# Patient Record
Sex: Male | Born: 1995 | Race: White | Hispanic: No | Marital: Single | Smoking: Never smoker
Health system: Southern US, Community
[De-identification: ages and names within clinical notes are randomized; demographics above are authoritative.]

## PROBLEM LIST (undated history)

## (undated) DIAGNOSIS — I514 Myocarditis, unspecified: Secondary | ICD-10-CM

## (undated) DIAGNOSIS — J302 Other seasonal allergic rhinitis: Secondary | ICD-10-CM

---

## 2016-01-27 ENCOUNTER — Emergency Department
Admission: EM | Admit: 2016-01-27 | Discharge: 2016-01-27 | Disposition: A | Discharge status: Home / Self Care | Attending: Student | Admitting: Student

## 2016-01-27 ENCOUNTER — Emergency Department

## 2016-01-27 ENCOUNTER — Encounter: Payer: Self-pay | Admitting: Emergency Medicine

## 2016-01-27 DIAGNOSIS — Y9389 Activity, other specified: Secondary | ICD-10-CM | POA: Insufficient documentation

## 2016-01-27 DIAGNOSIS — M79671 Pain in right foot: Secondary | ICD-10-CM

## 2016-01-27 DIAGNOSIS — Y999 Unspecified external cause status: Secondary | ICD-10-CM | POA: Insufficient documentation

## 2016-01-27 DIAGNOSIS — X501XXA Overexertion from prolonged static or awkward postures, initial encounter: Secondary | ICD-10-CM | POA: Insufficient documentation

## 2016-01-27 DIAGNOSIS — Y929 Unspecified place or not applicable: Secondary | ICD-10-CM | POA: Insufficient documentation

## 2016-01-27 DIAGNOSIS — S93601A Unspecified sprain of right foot, initial encounter: Secondary | ICD-10-CM | POA: Insufficient documentation

## 2016-01-27 MED ORDER — NAPROXEN 500 MG PO TBEC: 500.0000 mg | DELAYED_RELEASE_TABLET | Freq: Two times a day (BID) | ORAL | 0 refills | Status: DC

## 2016-01-27 NOTE — ED Provider Notes (Signed)
[facility] Emergency Department Provider Note ____________________________________________  Time seen: 29  I have reviewed the triage vital signs and the nursing notes.  HISTORY  Chief Complaint  Foot Pain  HPI Pedro Pugh is a 20 y.o. male presents to the ED for evaluation of increasing pain to the right foot. Patient describes pain to the medial right foot that began about a week prior. He describes significant increase in the pain today as well as pain with weightbearing. He denies any recent injury, accident, or trauma. The patient reports a recent history of a minor sprain to the right ankle about a month and a half earlier.  History reviewed. No pertinent past medical history.  There are no active problems to display for this patient.  No past surgical history on file.  Prior to Admission medications   Medication Sig Start Date End Date Taking? Authorizing Provider  naproxen (EC NAPROSYN) 500 MG EC tablet Take 1 tablet (500 mg total) by mouth 2 (two) times daily with a meal. 01/27/16   Shiah Berhow V Bacon Yoland Scherr, PA-C   Allergies Review of patient's allergies indicates no known allergies.  No family history on file.  Social History Social History  Substance Use Topics  . Smoking status: Not on file  . Smokeless tobacco: Not on file  . Alcohol use Not on file    Review of Systems  Constitutional: Negative for fever. Musculoskeletal: Negative for back pain. Right foot pain as above. Skin: Negative for rash. Neurological: Negative for headaches, focal weakness or numbness. ____________________________________________  PHYSICAL EXAM:  VITAL SIGNS: ED Triage Vitals [01/27/16 1800]  Enc Vitals Group     BP 136/88     Pulse Rate 87     Resp 16     Temp 98 F (36.7 C)     Temp Source Oral     SpO2 96 %     Weight 180 lb (81.6 kg)     Height 5\' 11"  (1.803 m)     Head Circumference      Peak Flow      Pain Score 7     Pain Loc    Pain Edu?      Excl. in GC?    Constitutional: Alert and oriented. Well appearing and in no distress. Head: Normocephalic and atraumatic. Cardiovascular: Normal distal pulses and capillary refill  Respiratory: Normal respiratory effort. Musculoskeletal: Right foot without obvious deformity, effusion, or dislocation. Patient with warm dry skin without ecchymosis, bruise, or abrasion. He localizes tenderness to the medial aspect of the right foot at the cuneiform bone. No calf or Achilles tenderness is appreciated. Full active range of motion of the ankle is noted negative drawer sign. Nontender with normal range of motion in all other extremities.  Neurologic:  Antalgic gait without ataxia. Normal speech and language. No gross focal neurologic deficits are appreciated. Skin:  Skin is warm, dry and intact. No rash noted. ____________________________________________   RADIOLOGY  Right Foot  IMPRESSION: Negative.  I, Carlyn Mullenbach, Charlesetta Ivory, personally viewed and evaluated these images (plain radiographs) as part of my medical decision making, as well as reviewing the written report by the radiologist. ____________________________________________  PROCEDURES  Ace bandage  Post-op shoe   ____________________________________________  INITIAL IMPRESSION / ASSESSMENT AND PLAN / ED COURSE  Patient with an acute foot pain without indication of occult fracture this time. He said with an Ace bandage and a postop shoe for comfort. He will follow with podiatry for ongoing  symptom management. He is discharged with a prescription for Naprosyn dose as directed. Return precautions are reviewed.  Clinical Course   ____________________________________________  FINAL CLINICAL IMPRESSION(S) / ED DIAGNOSES  Final diagnoses:  Foot pain, right  Sprain of right foot, initial encounter     Lissa HoardJenise V Bacon Burma Ketcher, PA-C 01/27/16 1911    Gayla DossEryka A Gayle, MD 01/27/16 1958

## 2016-01-27 NOTE — ED Triage Notes (Signed)
Pt reports right foot pain x1 week; reports hx of ankle fx 1 month ago. [facility] and some concern for stress fx. Pt denies any new injury.

## 2016-01-27 NOTE — ED Notes (Addendum)
Pt reports right foot hurts - [facility] and they told him he needed an xray - he rolled his foot the first week of Sept. And the pain got better but last week the pain returned and the right foot began to swell - pt has difficulty bearing weight on the foot but gait was steady walking to room

## 2016-01-27 NOTE — Discharge Instructions (Signed)
Wear the ace bandage and the post-op shoe as needed for support and comfort. Follow-up with podiatry for ongoing symptoms. Take the anti-inflammatory as needed for pain and inflammation.

## 2016-03-19 ENCOUNTER — Observation Stay
Admission: EM | Admit: 2016-03-19 | Discharge: 2016-03-20 | Disposition: A | Discharge status: Home / Self Care | Attending: Internal Medicine | Admitting: Internal Medicine

## 2016-03-19 ENCOUNTER — Emergency Department

## 2016-03-19 DIAGNOSIS — R072 Precordial pain: Secondary | ICD-10-CM | POA: Diagnosis not present

## 2016-03-19 DIAGNOSIS — R519 Headache, unspecified: Secondary | ICD-10-CM

## 2016-03-19 DIAGNOSIS — B349 Viral infection, unspecified: Secondary | ICD-10-CM | POA: Insufficient documentation

## 2016-03-19 DIAGNOSIS — R112 Nausea with vomiting, unspecified: Secondary | ICD-10-CM | POA: Diagnosis not present

## 2016-03-19 DIAGNOSIS — I514 Myocarditis, unspecified: Secondary | ICD-10-CM

## 2016-03-19 DIAGNOSIS — Z7982 Long term (current) use of aspirin: Secondary | ICD-10-CM | POA: Insufficient documentation

## 2016-03-19 DIAGNOSIS — R079 Chest pain, unspecified: Secondary | ICD-10-CM

## 2016-03-19 DIAGNOSIS — R739 Hyperglycemia, unspecified: Secondary | ICD-10-CM | POA: Diagnosis not present

## 2016-03-19 DIAGNOSIS — I10 Essential (primary) hypertension: Secondary | ICD-10-CM | POA: Diagnosis not present

## 2016-03-19 DIAGNOSIS — R7989 Other specified abnormal findings of blood chemistry: Secondary | ICD-10-CM

## 2016-03-19 DIAGNOSIS — I071 Rheumatic tricuspid insufficiency: Secondary | ICD-10-CM | POA: Diagnosis not present

## 2016-03-19 DIAGNOSIS — R0602 Shortness of breath: Secondary | ICD-10-CM | POA: Insufficient documentation

## 2016-03-19 DIAGNOSIS — R51 Headache: Secondary | ICD-10-CM | POA: Diagnosis present

## 2016-03-19 DIAGNOSIS — R778 Other specified abnormalities of plasma proteins: Secondary | ICD-10-CM

## 2016-03-19 HISTORY — DX: Other seasonal allergic rhinitis: J30.2

## 2016-03-19 LAB — URINE DRUG SCREEN, QUALITATIVE (ARMC ONLY)
Amphetamines, Ur Screen: NOT DETECTED
BARBITURATES, UR SCREEN: NOT DETECTED
Benzodiazepine, Ur Scrn: NOT DETECTED
CANNABINOID 50 NG, UR ~~LOC~~: NOT DETECTED
COCAINE METABOLITE, UR ~~LOC~~: NOT DETECTED
MDMA (Ecstasy)Ur Screen: NOT DETECTED
Methadone Scn, Ur: NOT DETECTED
OPIATE, UR SCREEN: NOT DETECTED
Phencyclidine (PCP) Ur S: NOT DETECTED
Tricyclic, Ur Screen: NOT DETECTED

## 2016-03-19 LAB — TROPONIN I
Troponin I: 0.15 ng/mL (ref <0.03)
Troponin I: 0.35 ng/mL (ref <0.03)
Troponin I: 0.32 ng/mL

## 2016-03-19 LAB — CBC
HEMATOCRIT: 45.5 % (ref 40.0–52.0)
Hemoglobin: 15.9 g/dL (ref 13.0–18.0)
MCH: 30.7 pg (ref 26.0–34.0)
MCHC: 35 g/dL (ref 32.0–36.0)
MCV: 87.8 fL (ref 80.0–100.0)
Platelets: 207 10*3/uL (ref 150–440)
RBC: 5.18 MIL/uL (ref 4.40–5.90)
RDW: 12.7 % (ref 11.5–14.5)
WBC: 5.8 10*3/uL (ref 3.8–10.6)

## 2016-03-19 LAB — BASIC METABOLIC PANEL
Anion gap: 7 (ref 5–15)
BUN: 12 mg/dL (ref 6–20)
CHLORIDE: 103 mmol/L (ref 101–111)
CO2: 27 mmol/L (ref 22–32)
Calcium: 9.4 mg/dL (ref 8.9–10.3)
Creatinine, Ser: 0.85 mg/dL (ref 0.61–1.24)
GFR calc Af Amer: >60 mL/min (ref >60)
GLUCOSE: 104 mg/dL — AB (ref 65–99)
POTASSIUM: 3.8 mmol/L (ref 3.5–5.1)
Sodium: 137 mmol/L (ref 135–145)

## 2016-03-19 LAB — TSH: TSH: 2.838 u[IU]/mL (ref 0.350–4.500)

## 2016-03-19 MED ORDER — SODIUM CHLORIDE 0.9% FLUSH
3.0000 mL | Freq: Two times a day (BID) | INTRAVENOUS | Status: DC
  Administered 2016-03-19 – 2016-03-20 (×2): 3 mL via INTRAVENOUS

## 2016-03-19 MED ORDER — ASPIRIN EC 325 MG PO TBEC
325.0000 mg | DELAYED_RELEASE_TABLET | Freq: Four times a day (QID) | ORAL | Status: DC
Start: 2016-03-19 — End: 2016-03-20
  Administered 2016-03-19 – 2016-03-20 (×5): 325 mg via ORAL
  Filled 2016-03-19 (×3): qty 1

## 2016-03-19 MED ORDER — INFLUENZA VAC SPLIT QUAD 0.5 ML IM SUSY
0.5000 mL | PREFILLED_SYRINGE | INTRAMUSCULAR | Status: AC
  Administered 2016-03-20: 0.5 mL via INTRAMUSCULAR
  Filled 2016-03-19: qty 0.5

## 2016-03-19 MED ORDER — CARVEDILOL 6.25 MG PO TABS
6.2500 mg | ORAL_TABLET | Freq: Two times a day (BID) | ORAL | Status: DC
  Administered 2016-03-20: 6.25 mg via ORAL
  Filled 2016-03-19: qty 1

## 2016-03-19 MED ORDER — PANTOPRAZOLE SODIUM 40 MG PO TBEC
40.0000 mg | DELAYED_RELEASE_TABLET | Freq: Every day | ORAL | Status: DC
  Administered 2016-03-19 – 2016-03-20 (×2): 40 mg via ORAL
  Filled 2016-03-19 (×2): qty 1

## 2016-03-19 MED ORDER — SODIUM CHLORIDE 0.9 % IV SOLN: 250.0000 mL | INTRAVENOUS | Status: DC | PRN

## 2016-03-19 MED ORDER — INDOMETHACIN 50 MG PO CAPS
50.0000 mg | ORAL_CAPSULE | Freq: Once | ORAL | Status: AC
  Administered 2016-03-19: 50 mg via ORAL
  Filled 2016-03-19 (×2): qty 1

## 2016-03-19 MED ORDER — ACETAMINOPHEN 650 MG RE SUPP: 650.0000 mg | Freq: Four times a day (QID) | RECTAL | Status: DC | PRN

## 2016-03-19 MED ORDER — ENOXAPARIN SODIUM 40 MG/0.4ML ~~LOC~~ SOLN
40.0000 mg | SUBCUTANEOUS | Status: DC
  Administered 2016-03-19: 40 mg via SUBCUTANEOUS
  Filled 2016-03-19: qty 0.4

## 2016-03-19 MED ORDER — SODIUM CHLORIDE 0.9 % IV BOLUS (SEPSIS)
1000.0000 mL | Freq: Once | INTRAVENOUS | Status: AC
  Administered 2016-03-19: 1000 mL via INTRAVENOUS

## 2016-03-19 MED ORDER — PROCHLORPERAZINE EDISYLATE 5 MG/ML IJ SOLN
10.0000 mg | Freq: Once | INTRAMUSCULAR | Status: AC
  Administered 2016-03-19: 10 mg via INTRAVENOUS
  Filled 2016-03-19: qty 2

## 2016-03-19 MED ORDER — IOPAMIDOL (ISOVUE-370) INJECTION 76%
75.0000 mL | Freq: Once | INTRAVENOUS | Status: AC | PRN
  Administered 2016-03-19: 75 mL via INTRAVENOUS

## 2016-03-19 MED ORDER — ACETAMINOPHEN 325 MG PO TABS
650.0000 mg | ORAL_TABLET | Freq: Four times a day (QID) | ORAL | Status: DC | PRN
  Administered 2016-03-20: 650 mg via ORAL
  Filled 2016-03-19: qty 2

## 2016-03-19 MED ORDER — ASPIRIN EC 81 MG PO TBEC
81.0000 mg | DELAYED_RELEASE_TABLET | Freq: Every day | ORAL | Status: DC
  Administered 2016-03-20: 81 mg via ORAL
  Filled 2016-03-19: qty 1

## 2016-03-19 MED ORDER — ONDANSETRON HCL 4 MG PO TABS: 4.0000 mg | ORAL_TABLET | Freq: Four times a day (QID) | ORAL | Status: DC | PRN

## 2016-03-19 MED ORDER — SODIUM CHLORIDE 0.9% FLUSH
3.0000 mL | Freq: Two times a day (BID) | INTRAVENOUS | Status: DC
  Administered 2016-03-20: 3 mL via INTRAVENOUS

## 2016-03-19 MED ORDER — DIPHENHYDRAMINE HCL 50 MG/ML IJ SOLN
25.0000 mg | Freq: Once | INTRAMUSCULAR | Status: AC
Start: 2016-03-19 — End: 2016-03-19
  Administered 2016-03-19: 25 mg via INTRAVENOUS
  Filled 2016-03-19: qty 1

## 2016-03-19 MED ORDER — ONDANSETRON HCL 4 MG/2ML IJ SOLN: 4.0000 mg | Freq: Four times a day (QID) | INTRAMUSCULAR | Status: DC | PRN

## 2016-03-19 MED ORDER — SODIUM CHLORIDE 0.9% FLUSH: 3.0000 mL | INTRAVENOUS | Status: DC | PRN

## 2016-03-19 MED ORDER — CARVEDILOL 3.125 MG PO TABS
3.1250 mg | ORAL_TABLET | Freq: Once | ORAL | Status: AC
  Administered 2016-03-19: 3.125 mg via ORAL
  Filled 2016-03-19: qty 1

## 2016-03-19 MED ORDER — CARVEDILOL 6.25 MG PO TABS
3.1250 mg | ORAL_TABLET | Freq: Two times a day (BID) | ORAL | Status: DC
  Administered 2016-03-19: 3.125 mg via ORAL
  Filled 2016-03-19: qty 1

## 2016-03-19 MED ORDER — ASPIRIN EC 325 MG PO TBEC
DELAYED_RELEASE_TABLET | ORAL | Status: AC
  Administered 2016-03-19: 325 mg via ORAL
  Filled 2016-03-19: qty 1

## 2016-03-19 NOTE — ED Provider Notes (Signed)
[facility] Emergency Department Provider Note  ____________________________________________   First MD Initiated Contact with Patient 03/19/16 [phone]     (approximate)  I have reviewed the triage vital signs and the nursing notes.   HISTORY  Chief Complaint Headache; Emesis; and Chest Pain   HPI Pedro Pugh is a 20 y.o. male with a history of chronic tension headaches was presented to the emergency department 2 days of headache. He says that he was awoken 2 nights ago in the middle the night with a 9 out of 10 sudden onset headache. He says that since then the headache had decreased but then this morning about 3 AM he had again sudden onset of his headache which she describes as a 9 out of 10. He says it is an 8 out of 10 at this time and it is associated with neck stiffness. He has vomited once and he says that he has photophobia. He says that he also feels dizzy especially when he moves his head from side to side or stands. Says that the headache also is worsening with movement and standing. Took a gram of Tylenol this morning but says that he vomited soon after.  Patient said that he also had an aching 4-5 out of 10 chest pain this morning that has now reduced down to a 2. He says that it was in the center of his chest that was nonradiating. Denies any shortness of breath.  History reviewed. No pertinent past medical history.  There are no active problems to display for this patient.   No past surgical history on file.  Prior to Admission medications   Medication Sig Start Date End Date Taking? Authorizing Provider  meloxicam (MOBIC) 15 MG tablet Take 15 tablets by mouth daily. 03/03/16 04/02/16 Yes Historical Provider, MD  naproxen (EC NAPROSYN) 500 MG EC tablet Take 1 tablet (500 mg total) by mouth 2 (two) times daily with a meal. Patient not taking: Reported on 03/19/2016 01/27/16   Charlesetta Ivory Menshew, PA-C    Allergies Patient has no known  allergies.  No family history on file.  Social History Social History  Substance Use Topics  . Smoking status: Not on file  . Smokeless tobacco: Not on file  . Alcohol use Not on file    Review of Systems Constitutional: No fever/chills Eyes: No visual changes. ENT: No sore throat. Cardiovascular: As above Respiratory: Denies shortness of breath. Gastrointestinal: No abdominal pain.  No nausea, no vomiting.  No diarrhea.  No constipation. Genitourinary: Negative for dysuria. Musculoskeletal: Negative for back pain. Skin: Negative for rash. Neurological: Negative for focal weakness or numbness.  10-point ROS otherwise negative.  ____________________________________________   PHYSICAL EXAM:  VITAL SIGNS: ED Triage Vitals [03/19/16 0823]  Enc Vitals Group     BP (!) 142/86     Pulse Rate 96     Resp 18     Temp 99.3 F (37.4 C)     Temp Source Oral     SpO2 96 %     Weight 175 lb (79.4 kg)     Height 5\' 11"  (1.803 m)     Head Circumference      Peak Flow      Pain Score 8     Pain Loc      Pain Edu?      Excl. in GC?     Constitutional: Alert and oriented. Well appearing and in no acute distress. Eyes: Conjunctivae are normal.  PERRL. EOMI. Head: Atraumatic. Nose: No congestion/rhinnorhea. Mouth/Throat: Mucous membranes are moist.   Neck: No stridor.  Able to range head and neck but says it feels stiff. Cardiovascular: Normal rate, regular rhythm. Grossly normal heart sounds.  Equal and bilateral radial as well as dorsalis pedis pulses. Respiratory: Normal respiratory effort.  No retractions. Lungs CTAB. Gastrointestinal: Soft and nontender. No distention. No abdominal bruits. No CVA tenderness. Musculoskeletal: No lower extremity tenderness nor edema.  No joint effusions. Neurologic:  Normal speech and language. No gross focal neurologic deficits are appreciated. No nystagmus. No ataxia on finger to nose or heel to shin testing. Skin:  Skin is warm, dry and  intact. No rash noted. Psychiatric: Mood and affect are normal. Speech and behavior are normal.  ____________________________________________   LABS (all labs ordered are listed, but only abnormal results are displayed)  Labs Reviewed  BASIC METABOLIC PANEL - Abnormal; Notable for the following:       Result Value   Glucose, Bld 104 (*)    All other components within normal limits  TROPONIN I - Abnormal; Notable for the following:    Troponin I 0.15 (*)    All other components within normal limits  TROPONIN I - Abnormal; Notable for the following:    Troponin I 0.35 (*)    All other components within normal limits  CBC  URINE DRUG SCREEN, QUALITATIVE ([facility] ONLY)   ____________________________________________  EKG  ED ECG REPORT I, Arelia LongestSchaevitz,  David M, the attending physician, personally viewed and interpreted this ECG.   Date: 03/19/2016  EKG Time: 0830  Rate: 95  Rhythm: normal sinus rhythm  Axis: Normal  Intervals:none  ST&T Change: No ST segment elevation or depression. No abnormal T-wave inversion.  ____________________________________________  RADIOLOGY    CT Head Wo Contrast (Final result)  Result time 03/19/16 10:30:48  Final result by Bretta BangWilliam Woodruff III, MD (03/19/16 10:30:48)           Narrative:   CLINICAL DATA: Severe headache for 1 day with nausea and vomiting  EXAM: CT HEAD WITHOUT CONTRAST  TECHNIQUE: Contiguous axial images were obtained from the base of the skull through the vertex without intravenous contrast.  COMPARISON: None.  FINDINGS: Brain: The ventricles are normal in size and configuration. There is no intracranial mass, hemorrhage, extra-axial fluid collection, or midline shift. Gray-white compartments appear normal. No acute infarct evident.  Vascular: There is no hyperdense vessel. There are no vascular calcifications.  Skull: The bony calvarium appears intact.  Sinuses/Orbits: There is mild mucosal thickening in  several ethmoid air cells bilaterally. Visualized paranasal sinuses elsewhere clear. Visualized orbits appear symmetric bilaterally.  Other: Visualized mastoid air cells are clear bilaterally.  IMPRESSION: Mild ethmoid air cell thickening at several sites. No intracranial mass hemorrhage, or extra-axial fluid collection. Gray-white compartments appear normal.   Electronically Signed By: Bretta BangWilliam Woodruff III M.D. On: 03/19/2016 10:30            DG Chest 2 View (Final result)  Result time 03/19/16 09:01:06  Final result by David A SwazilandJordan, MD (03/19/16 09:01:06)           Narrative:   CLINICAL DATA: Central chest burning sensation associated with dizziness, shortness of breath, and vomiting since this morning. Nonsmoker.  EXAM: CHEST 2 VIEW  COMPARISON: Chest x-ray of March 19, 2016  FINDINGS: The lungs are well-expanded and clear. The heart and pulmonary vascularity are normal. The mediastinum is normal in width. There is no pleural effusion, pneumothorax, or pneumomediastinum.  The observed bony thorax is unremarkable.  IMPRESSION: There is no active cardiopulmonary disease.   Electronically Signed By: David SwazilandJordan M.D. On: 03/19/2016 09:01            ____________________________________________   PROCEDURES  Procedure(s) performed:   Procedures  Critical Care performed:   ____________________________________________   INITIAL IMPRESSION / ASSESSMENT AND PLAN / ED COURSE  Pertinent labs & imaging results that were available during my care of the patient were reviewed by me and considered in my medical decision making (see chart for details).   Clinical Course    ----------------------------------------- 1:54 PM on 03/19/2016 -----------------------------------------  Patient with headache now at a 3 out of 10. Still says he has mild neck stiffness. However he is able to fully range his head and his neck. No documented  fever. His troponin continues to trend upward. I'm suspecting that this is consistent with myocarditis likely from a viral cause. Patient with a headache history with 2 severe headaches over the last 2 days. However, very unlikely to have concomitant subarachnoid hemorrhage with myocarditis. Reassuring CAT scans without evidence of acute pathology. We will forego lumbar puncture at this time because of more likely diagnosis. Discussed the case with Dr. Welton FlakesKhan of cardiology who recommends Indocin. I discussed the case with the patient as well as the patient's mother who is an emergency department nurse. They are understanding of the plan for admission and elected to comply. Signed out to Dr. Seth BakeV. Headaches likely related to viral syndrome.   ____________________________________________   FINAL CLINICAL IMPRESSION(S) / ED DIAGNOSES  Myocarditis. Headache.    NEW MEDICATIONS STARTED DURING THIS VISIT:  New Prescriptions   No medications on file     Note:  This document was prepared using Dragon voice recognition software and may include unintentional dictation errors.    Myrna Blazeravid Matthew Schaevitz, MD 03/19/16 1356

## 2016-03-19 NOTE — H&P (Signed)
[facility] [place] [facility]   PATIENT NAME: Pedro Pugh    MR#:  [id number]  DATE OF BIRTH:  05/11/1995  DATE OF ADMISSION:  03/19/2016  PRIMARY CARE PHYSICIAN: No PCP Per Patient   REQUESTING/REFERRING PHYSICIAN:   CHIEF COMPLAINT:   Chief Complaint  Patient presents with  . Headache  . Emesis  . Chest Pain    HISTORY OF PRESENT ILLNESS: Pedro Pugh  is a 20 y.o. male with a known history of Tension headaches, seasonal allergies,, who presents to the hospital with complaints of severe headache and chest pain. He did patient was doing well up until morning at around 3 AM when he was woken up by severe chest pain and headache. Pain was described as midsternal chest pain, throbbing, burning sensation with no alleviating or aggravating factors, with no radiation, accompanied by shortness of breath, dizziness, lightheadedness. He also had severe headache which was temporal and in the occipital area, neck , it  was 10/10 , and similar location to his tension headaches, but so much more intense. Because of this severe discomfort, he decided to come to emergency room for further evaluation and treatment. He also had epigastric abdominal discomfort that had nausea and vomiting episode earlier today. Hospitalist services were contacted for admission. Since patient's troponin was found to be elevated. The patient was given Compazine and Benadryl, his headache subsided to 2 out of 10 by intensity.  PAST MEDICAL HISTORY:  Seasonal allergies, tension headaches  PAST SURGICAL HISTORY: No past surgical history on file.  SOCIAL HISTORY:  Social History  Substance Use Topics  . Smoking status: Not on file  . Smokeless tobacco: Not on file  . Alcohol use Not on file    FAMILY HISTORY: No early coronary artery disease .  DRUG ALLERGIES: No Known Allergies  Review of Systems  Eyes: Positive for blurred vision.  Cardiovascular: Positive for chest pain.   Gastrointestinal: Positive for abdominal pain, nausea and vomiting.    MEDICATIONS AT HOME:  Prior to Admission medications   Medication Sig Start Date End Date Taking? Authorizing Provider  meloxicam (MOBIC) 15 MG tablet Take 15 tablets by mouth daily. 03/03/16 04/02/16 Yes Historical Provider, MD  naproxen (EC NAPROSYN) 500 MG EC tablet Take 1 tablet (500 mg total) by mouth 2 (two) times daily with a meal. Patient not taking: Reported on 03/19/2016 01/27/16   Jenise V Bacon Menshew, PA-C      PHYSICAL EXAMINATION:   VITAL SIGNS: Blood pressure 124/84, pulse 85, temperature 99.3 F (37.4 C), temperature source Oral, resp. rate 14, height 5\' 11"  (1.803 m), weight 79.4 kg (175 lb), SpO2 97 %.  GENERAL:  20 y.o.-year-old patient lying in the bed with no acute distress.  EYES: Pupils equal, round, reactive to light and accommodation. No scleral icterus. Extraocular muscles intact.  HEENT: Head atraumatic, normocephalic. Oropharynx and nasopharynx clear.  NECK:  Supple, no jugular venous distention. No thyroid enlargement, no tenderness.  LUNGS: Normal breath sounds bilaterally, no wheezing, rales,rhonchi or crepitation. No use of accessory muscles of respiration.  CARDIOVASCULAR: S1, S2 normal. No murmurs, rubs, or gallops.  ABDOMEN: Soft, nontender, nondistended. Bowel sounds present. No organomegaly or mass.  EXTREMITIES: No pedal edema, cyanosis, or clubbing.  NEUROLOGIC: Cranial nerves II through XII are intact. Muscle strength 5/5 in all extremities. Sensation intact. Gait not checked.  PSYCHIATRIC: The patient is alert and oriented x 3.  SKIN: No obvious rash, lesion, or ulcer.   LABORATORY PANEL:  CBC  Recent Labs Lab 03/19/16 0831  WBC 5.8  HGB 15.9  HCT 45.5  PLT 207  MCV 87.8  MCH 30.7  MCHC 35.0  RDW 12.7   ------------------------------------------------------------------------------------------------------------------  Chemistries   Recent Labs Lab  03/19/16 0831  NA 137  K 3.8  CL 103  CO2 27  GLUCOSE 104*  BUN 12  CREATININE 0.85  CALCIUM 9.4   ------------------------------------------------------------------------------------------------------------------  Cardiac Enzymes  Recent Labs Lab 03/19/16 0831 03/19/16 1225  TROPONINI 0.15* 0.35*   ------------------------------------------------------------------------------------------------------------------  RADIOLOGY: Dg Chest 2 View  Result Date: 03/19/2016 CLINICAL DATA:  Central chest burning sensation associated with dizziness, shortness of breath, and vomiting since this morning. Nonsmoker. EXAM: CHEST  2 VIEW COMPARISON:  Chest x-ray of March 19, 2016 FINDINGS: The lungs are well-expanded and clear. The heart and pulmonary vascularity are normal. The mediastinum is normal in width. There is no pleural effusion, pneumothorax, or pneumomediastinum. The observed bony thorax is unremarkable. IMPRESSION: There is no active cardiopulmonary disease. Electronically Signed   By: David  SwazilandJordan M.D.   On: 03/19/2016 09:01   Ct Head Wo Contrast  Result Date: 03/19/2016 CLINICAL DATA:  Severe headache for 1 day with nausea and vomiting EXAM: CT HEAD WITHOUT CONTRAST TECHNIQUE: Contiguous axial images were obtained from the base of the skull through the vertex without intravenous contrast. COMPARISON:  None. FINDINGS: Brain: The ventricles are normal in size and configuration. There is no intracranial mass, hemorrhage, extra-axial fluid collection, or midline shift. Gray-white compartments appear normal. No acute infarct evident. Vascular: There is no hyperdense vessel. There are no vascular calcifications. Skull: The bony calvarium appears intact. Sinuses/Orbits: There is mild mucosal thickening in several ethmoid air cells bilaterally. Visualized paranasal sinuses elsewhere clear. Visualized orbits appear symmetric bilaterally. Other: Visualized mastoid air cells are clear  bilaterally. IMPRESSION: Mild ethmoid air cell thickening at several sites. No intracranial mass hemorrhage, or extra-axial fluid collection. Gray-white compartments appear normal. Electronically Signed   By: Bretta BangWilliam  Woodruff III M.D.   On: 03/19/2016 10:30   Ct Angio Chest Aorta W And/or Wo Contrast  Result Date: 03/19/2016 CLINICAL DATA:  Chest pain and shortness of breath EXAM: CT ANGIOGRAPHY CHEST WITH CONTRAST TECHNIQUE: Initially, axial CT images were obtained through the chest without intravenous contrast material administration. Multidetector CT imaging of the chest was performed using the standard protocol during bolus administration of intravenous contrast. Multiplanar CT image reconstructions and MIPs were obtained to evaluate the vascular anatomy. CONTRAST:  75 mL Isovue 370 nonionic COMPARISON:  Chest radiograph March 19, 2016 FINDINGS: Cardiovascular: No intramural hematoma evident on noncontrast enhanced study. There is no thoracic aortic aneurysm or dissection. No ulceration or mucosal lesion is evident by CT in the thoracic aorta. The visualized great vessels appear unremarkable. The pericardium is not appreciably thickened. There is no demonstrable pulmonary embolus. Mediastinum/Nodes: Visualized thyroid appears normal. There is no appreciable thoracic adenopathy. Lungs/Pleura: Lungs are clear. No pleural effusion or pleural thickening. Upper Abdomen: In the visualized upper abdomen, the spleen is incompletely visualized. From anterior to posterior dimension, the spleen measures 14.6 cm in length which is enlarged. Visualized upper abdominal structures otherwise appear unremarkable. Musculoskeletal: There are no blastic or lytic bone lesions. No chest wall lesions are evident. Review of the MIP images confirms the above findings. IMPRESSION: No demonstrable thoracic aortic aneurysm or dissection. No pulmonary embolus demonstrable. Note that this study was timed to optimize aortic  visualization as opposed to pulmonary arterial visualization. Lungs clear.  No evident adenopathy.  Spleen incompletely visualized. Spleen is felt to be enlarged based on anterior to posterior dimension of 14.6 cm. Electronically Signed   By: Bretta Bang III M.D.   On: 03/19/2016 11:23    EKG: Orders placed or performed during the hospital encounter of 03/19/16  . ED EKG within 10 minutes  . ED EKG within 10 minutes  . EKG 12-Lead  . EKG 12-Lead   EKG revealed normal sinus rhythm at 95 bpm, short PR interval, borderline repolarization abnormality, no obesity changes  IMPRESSION AND PLAN:  Principal Problem:   Chest pain Active Problems:   Elevated troponin   Headache   Hyperglycemia  #1. Chest pain, related troponin, suspected myocarditis, initiate patient on aspirin therapy every 6 hours him a cardiology consultation, echocardiogram, follow cardiac enzymes 3, initiate patient on Coreg, add ACE inhibitor if cardiac function is compromised #2. Elevated troponin, as above, suspected myocarditis, follow cardiac enzymes, Coreg, Lovenox subcutaneous, , aspirin #3. Headache, unclear etiology, CT of head was unremarkable, patient has history of tension headaches, per his primary care physician, get neurologist involved recommendations as patient is taking lots of nonsteroidal anti-inflammatory medications and Tylenol on daily basis #4. Hyperglycemia, get hemoglobin A1c to rule out diabetes  All the records are reviewed and case discussed with ED provider. Management plans discussed with the patient, family and they are in agreement.  CODE STATUS: Code Status History    This patient does not have a recorded code status. Please follow your organizational policy for patients in this situation.       TOTAL TIME TAKING CARE OF THIS PATIENT: 50 minutes.    Katharina Caper M.D on 03/19/2016 at 2:47 PM  Between 7am to 6pm - Pager - [phone] After 6pm go to [email or web address] - password  [facility]  [place]  Office  [phone]  CC: Primary care physician; No PCP Per Patient

## 2016-03-19 NOTE — ED Notes (Signed)
Patient transported to X-ray 

## 2016-03-19 NOTE — ED Triage Notes (Signed)
Pt reports headache, nausea and vomiting and central chest pain for one day. Pt denies diarrhea. Pt states he feels like he has had a fever. Pt reports history of headaches but this headache is worse than normal. Pt ambulatory to triage. No apparent distress noted.

## 2016-03-19 NOTE — ED Notes (Signed)
Patient transported to CT 

## 2016-03-19 NOTE — Progress Notes (Signed)
Spoke with Dr. Denzil Magnusonviackute troponin down to 0.32. Dr. Denzil MagnusonViackute ordered 3.125 of coreg now and 6.25 of coreg bid.

## 2016-03-19 NOTE — Progress Notes (Signed)
Admission:  Patient alert and oriented. Patient not complaining of any pain at the moment. Patient heart sounds normal and lung sounds clear. Patient has no skin issues. Patient said that he was having chest pain and a headache that worsened with movement before coming to the hospital. Patient denies any chest pain. Patient sinus tachycardic on telemetry. Patient's right foot is wrapped up from a previous injury in August. Patient said that doctor says he has plantar fasciitis and has been taking Mobic for it for the past 2 weeks.   Harvie HeckMelanie Martin Smeal, RN

## 2016-03-19 NOTE — ED Notes (Signed)
Dr. Pershing ProudSchaevitz informed of pt troponin. No orders received.

## 2016-03-20 ENCOUNTER — Observation Stay
Admit: 2016-03-20 | Discharge: 2016-03-20 | Disposition: A | Discharge status: Home / Self Care | Attending: Internal Medicine | Admitting: Internal Medicine

## 2016-03-20 DIAGNOSIS — G44211 Episodic tension-type headache, intractable: Secondary | ICD-10-CM

## 2016-03-20 LAB — HEMOGLOBIN A1C
Hgb A1c MFr Bld: 4.7 % — ABNORMAL LOW (ref 4.8–5.6)
Mean Plasma Glucose: 88 mg/dL

## 2016-03-20 LAB — BASIC METABOLIC PANEL
ANION GAP: 8 (ref 5–15)
BUN: 11 mg/dL (ref 6–20)
CHLORIDE: 104 mmol/L (ref 101–111)
CO2: 25 mmol/L (ref 22–32)
Calcium: 9 mg/dL (ref 8.9–10.3)
Creatinine, Ser: 0.84 mg/dL (ref 0.61–1.24)
GFR calc non Af Amer: >60 mL/min (ref >60)
GLUCOSE: 90 mg/dL (ref 65–99)
POTASSIUM: 3.9 mmol/L (ref 3.5–5.1)
Sodium: 137 mmol/L (ref 135–145)

## 2016-03-20 LAB — ECHOCARDIOGRAM COMPLETE
HEIGHTINCHES: 71 in
WEIGHTICAEL: 2800 [oz_av]

## 2016-03-20 LAB — CBC
HEMATOCRIT: 42.2 % (ref 40.0–52.0)
HEMOGLOBIN: 15.1 g/dL (ref 13.0–18.0)
MCH: 31.1 pg (ref 26.0–34.0)
MCHC: 35.6 g/dL (ref 32.0–36.0)
MCV: 87.3 fL (ref 80.0–100.0)
Platelets: 190 10*3/uL (ref 150–440)
RBC: 4.84 MIL/uL (ref 4.40–5.90)
RDW: 12.4 % (ref 11.5–14.5)
WBC: 3.9 10*3/uL (ref 3.8–10.6)

## 2016-03-20 LAB — TROPONIN I
TROPONIN I: 0.23 ng/mL — AB (ref <0.03)
Troponin I: 0.21 ng/mL (ref <0.03)

## 2016-03-20 MED ORDER — OXYCODONE-ACETAMINOPHEN 5-325 MG PO TABS
1.0000 | ORAL_TABLET | Freq: Once | ORAL | Status: AC
  Administered 2016-03-20: 1 via ORAL
  Filled 2016-03-20: qty 1

## 2016-03-20 MED ORDER — ASPIRIN 81 MG PO TBEC: 81.0000 mg | DELAYED_RELEASE_TABLET | Freq: Every day | ORAL | 2 refills | Status: DC

## 2016-03-20 MED ORDER — TRAMADOL HCL 50 MG PO TABS: 50.0000 mg | ORAL_TABLET | Freq: Three times a day (TID) | ORAL | 0 refills | Status: DC | PRN

## 2016-03-20 MED ORDER — LOSARTAN POTASSIUM 25 MG PO TABS
25.0000 mg | ORAL_TABLET | Freq: Every day | ORAL | Status: DC
  Administered 2016-03-20: 25 mg via ORAL
  Filled 2016-03-20: qty 1

## 2016-03-20 MED ORDER — CARVEDILOL 6.25 MG PO TABS: 6.2500 mg | ORAL_TABLET | Freq: Two times a day (BID) | ORAL | 2 refills | Status: DC

## 2016-03-20 MED ORDER — LOSARTAN POTASSIUM 25 MG PO TABS: 25.0000 mg | ORAL_TABLET | Freq: Every day | ORAL | 2 refills | Status: DC

## 2016-03-20 NOTE — Progress Notes (Signed)
Pedro Pugh to be D/C'd Home per MD order.  Discussed with the patient and all questions fully answered.  VSS, Skin clean, dry and intact without evidence of skin break down, no evidence of skin tears noted. IV catheter discontinued intact. Site without signs and symptoms of complications. Dressing and pressure applied.  An After Visit Summary was printed and given to the patient. Patient received prescription.  D/c education completed with patient/family including follow up instructions, medication list, d/c activities limitations if indicated, with other d/c instructions as indicated by MD - patient able to verbalize understanding, all questions fully answered.   Patient instructed to return to ED, call 911, or call MD for any changes in condition.   Patient escorted via WC, and D/C home via private auto.  [name] 03/20/2016 3:43 PM

## 2016-03-20 NOTE — Progress Notes (Signed)
*  PRELIMINARY RESULTS* Echocardiogram 2D Echocardiogram has been performed.  Pedro Pugh 03/20/2016, 9:38 AM

## 2016-03-20 NOTE — Consult Note (Signed)
Reason for Consult:Headaches Referring Physician: Winona LegatoVaickute  CC: Headaches  HPI: Pedro Pugh is an 20 y.o. male who was admitted with chest pain.  Reports that he has had headaches for the past 4-5 years at least.  The headaches occur about every other day.  They are bitemporal and dull.  They have no associated nausea, vomiting, photophobia or phonophobia.  Usually they are at a severity of 3-4/10.  For the past 1-2 weeks his pain has been more severe.  They now are getting to a 10/10.  Current admission was after one of these headaches awakened him at night and he had associated chest pain.  He reports no neurological deficits or change in vision.  Headache was relieved in the ED and is now at his usual severity.  Reports no recent change in diet, medications or sleeping habits.    Past Medical History:  Diagnosis Date  . Seasonal allergies     History reviewed. No pertinent surgical history.  Family history: Mother alive and well  Social History:  reports that he has never smoked. He has never used smokeless tobacco. His alcohol and drug histories are not on file.  No Known Allergies  Medications:  I have reviewed the patient's current medications. Prior to Admission:  Prescriptions Prior to Admission  Medication Sig Dispense Refill Last Dose  . meloxicam (MOBIC) 15 MG tablet Take 15 tablets by mouth daily.   03/18/2016 at 1000  . naproxen (EC NAPROSYN) 500 MG EC tablet Take 1 tablet (500 mg total) by mouth 2 (two) times daily with a meal. (Patient not taking: Reported on 03/19/2016) 30 tablet 0 Not Taking at Unknown time   Scheduled: . aspirin EC  325 mg Oral Q6H  . aspirin EC  81 mg Oral Daily  . carvedilol  6.25 mg Oral BID WC  . enoxaparin (LOVENOX) injection  40 mg Subcutaneous Q24H  . pantoprazole  40 mg Oral Daily  . sodium chloride flush  3 mL Intravenous Q12H  . sodium chloride flush  3 mL Intravenous Q12H    ROS: History obtained from the patient  General ROS:  negative for - chills, fatigue, fever, night sweats, weight gain or weight loss Psychological ROS: negative for - behavioral disorder, hallucinations, memory difficulties, mood swings or suicidal ideation Ophthalmic ROS: poor vision at baseline ENT ROS: negative for - epistaxis, nasal discharge, oral lesions, sore throat, tinnitus or vertigo Allergy and Immunology ROS: negative for - hives or itchy/watery eyes Hematological and Lymphatic ROS: negative for - bleeding problems, bruising or swollen lymph nodes Endocrine ROS: negative for - galactorrhea, hair pattern changes, polydipsia/polyuria or temperature intolerance Respiratory ROS: negative for - cough, hemoptysis, shortness of breath or wheezing Cardiovascular ROS: negative for - chest pain, dyspnea on exertion, edema or irregular heartbeat Gastrointestinal ROS: negative for - abdominal pain, diarrhea, hematemesis, nausea/vomiting or stool incontinence Genito-Urinary ROS: negative for - dysuria, hematuria, incontinence or urinary frequency/urgency Musculoskeletal ROS: negative for - joint swelling or muscular weakness Neurological ROS: as noted in HPI Dermatological ROS: negative for rash and skin lesion changes  Physical Examination: Blood pressure 108/73, pulse 74, temperature 97.6 F (36.4 C), temperature source Oral, resp. rate 18, height 5\' 11"  (1.803 m), weight 79.4 kg (175 lb), SpO2 98 %.  HEENT-  Normocephalic, no lesions, without obvious abnormality.  Normal external eye and conjunctiva.  Normal TM's bilaterally.  Normal auditory canals and external ears. Normal external nose, mucus membranes and septum.  Normal pharynx. Cardiovascular- S1, S2 normal,  pulses palpable throughout   Lungs- chest clear, no wheezing, rales, normal symmetric air entry Abdomen- soft, non-tender; bowel sounds normal; no masses,  no organomegaly Extremities- no edema Lymph-no adenopathy palpable Musculoskeletal-no joint tenderness, deformity or  swelling Skin-warm and dry, no hyperpigmentation, vitiligo, or suspicious lesions  Neurological Examination Mental Status: Alert, oriented, thought content appropriate.  Speech fluent without evidence of aphasia.  Able to follow 3 step commands without difficulty. Cranial Nerves: II: Discs flat bilaterally; Visual fields grossly normal, pupils equal, round, reactive to light and accommodation III,IV, VI: ptosis not present, extra-ocular motions intact bilaterally V,VII: smile symmetric, facial light touch sensation normal bilaterally VIII: hearing normal bilaterally IX,X: gag reflex present XI: bilateral shoulder shrug XII: midline tongue extension Motor: Right : Upper extremity   5/5    Left:     Upper extremity   5/5  Lower extremity   5/5     Lower extremity   5/5 Tone and bulk:normal tone throughout; no atrophy noted Sensory: Pinprick and light touch intact throughout, bilaterally Deep Tendon Reflexes: 2+ and symmetric throughout Plantars: Right: downgoing   Left: downgoing Cerebellar: normal finger-to-nose, normal rapid alternating movements and normal heel-to-shin test Gait: normal gait and station    Laboratory Studies:   Basic Metabolic Panel:  Recent Labs Lab 03/19/16 0831 03/20/16 0645  NA 137 137  K 3.8 3.9  CL 103 104  CO2 27 25  GLUCOSE 104* 90  BUN 12 11  CREATININE 0.85 0.84  CALCIUM 9.4 9.0    Liver Function Tests: No results for input(s): AST, ALT, ALKPHOS, BILITOT, PROT, ALBUMIN in the last 168 hours. No results for input(s): LIPASE, AMYLASE in the last 168 hours. No results for input(s): AMMONIA in the last 168 hours.  CBC:  Recent Labs Lab 03/19/16 0831 03/20/16 0645  WBC 5.8 3.9  HGB 15.9 15.1  HCT 45.5 42.2  MCV 87.8 87.3  PLT 207 190    Cardiac Enzymes:  Recent Labs Lab 03/19/16 0831 03/19/16 1225 03/19/16 1817 03/20/16 0013 03/20/16 0645  TROPONINI 0.15* 0.35* 0.32* 0.21* 0.23*    BNP: Invalid input(s):  POCBNP  CBG: No results for input(s): GLUCAP in the last 168 hours.  Microbiology: No results found for this or any previous visit.  Coagulation Studies: No results for input(s): LABPROT, INR in the last 72 hours.  Urinalysis: No results for input(s): COLORURINE, LABSPEC, PHURINE, GLUCOSEU, HGBUR, BILIRUBINUR, KETONESUR, PROTEINUR, UROBILINOGEN, NITRITE, LEUKOCYTESUR in the last 168 hours.  Invalid input(s): APPERANCEUR  Lipid Panel:  No results found for: CHOL, TRIG, HDL, CHOLHDL, VLDL, LDLCALC  HgbA1C:  Lab Results  Component Value Date   HGBA1C 4.7 (L) 03/19/2016    Urine Drug Screen:     Component Value Date/Time   LABOPIA NONE DETECTED 03/19/2016 1138   COCAINSCRNUR NONE DETECTED 03/19/2016 1138   LABBENZ NONE DETECTED 03/19/2016 1138   AMPHETMU NONE DETECTED 03/19/2016 1138   THCU NONE DETECTED 03/19/2016 1138   LABBARB NONE DETECTED 03/19/2016 1138    Alcohol Level: No results for input(s): ETH in the last 168 hours.  Other results: EKG: sinus rhythm at 95 bpm.  Imaging: Dg Chest 2 View  Result Date: 03/19/2016 CLINICAL DATA:  Central chest burning sensation associated with dizziness, shortness of breath, and vomiting since this morning. Nonsmoker. EXAM: CHEST  2 VIEW COMPARISON:  Chest x-ray of March 19, 2016 FINDINGS: The lungs are well-expanded and clear. The heart and pulmonary vascularity are normal. The mediastinum is normal in width. There is no  pleural effusion, pneumothorax, or pneumomediastinum. The observed bony thorax is unremarkable. IMPRESSION: There is no active cardiopulmonary disease. Electronically Signed   By: David  [place] M.D.   On: 03/19/2016 09:01   Ct Head Wo Contrast  Result Date: 03/19/2016 CLINICAL DATA:  Severe headache for 1 day with nausea and vomiting EXAM: CT HEAD WITHOUT CONTRAST TECHNIQUE: Contiguous axial images were obtained from the base of the skull through the vertex without intravenous contrast. COMPARISON:  None. FINDINGS:  Brain: The ventricles are normal in size and configuration. There is no intracranial mass, hemorrhage, extra-axial fluid collection, or midline shift. Gray-white compartments appear normal. No acute infarct evident. Vascular: There is no hyperdense vessel. There are no vascular calcifications. Skull: The bony calvarium appears intact. Sinuses/Orbits: There is mild mucosal thickening in several ethmoid air cells bilaterally. Visualized paranasal sinuses elsewhere clear. Visualized orbits appear symmetric bilaterally. Other: Visualized mastoid air cells are clear bilaterally. IMPRESSION: Mild ethmoid air cell thickening at several sites. No intracranial mass hemorrhage, or extra-axial fluid collection. Gray-white compartments appear normal. Electronically Signed   By: Bretta Bang III M.D.   On: 03/19/2016 10:30   Ct Angio Chest Aorta W And/or Wo Contrast  Result Date: 03/19/2016 CLINICAL DATA:  Chest pain and shortness of breath EXAM: CT ANGIOGRAPHY CHEST WITH CONTRAST TECHNIQUE: Initially, axial CT images were obtained through the chest without intravenous contrast material administration. Multidetector CT imaging of the chest was performed using the standard protocol during bolus administration of intravenous contrast. Multiplanar CT image reconstructions and MIPs were obtained to evaluate the vascular anatomy. CONTRAST:  75 mL Isovue 370 nonionic COMPARISON:  Chest radiograph March 19, 2016 FINDINGS: Cardiovascular: No intramural hematoma evident on noncontrast enhanced study. There is no thoracic aortic aneurysm or dissection. No ulceration or mucosal lesion is evident by CT in the thoracic aorta. The visualized great vessels appear unremarkable. The pericardium is not appreciably thickened. There is no demonstrable pulmonary embolus. Mediastinum/Nodes: Visualized thyroid appears normal. There is no appreciable thoracic adenopathy. Lungs/Pleura: Lungs are clear. No pleural effusion or pleural  thickening. Upper Abdomen: In the visualized upper abdomen, the spleen is incompletely visualized. From anterior to posterior dimension, the spleen measures 14.6 cm in length which is enlarged. Visualized upper abdominal structures otherwise appear unremarkable. Musculoskeletal: There are no blastic or lytic bone lesions. No chest wall lesions are evident. Review of the MIP images confirms the above findings. IMPRESSION: No demonstrable thoracic aortic aneurysm or dissection. No pulmonary embolus demonstrable. Note that this study was timed to optimize aortic visualization as opposed to pulmonary arterial visualization. Lungs clear.  No evident adenopathy. Spleen incompletely visualized. Spleen is felt to be enlarged based on anterior to posterior dimension of 14.6 cm. Electronically Signed   By: Bretta Bang III M.D.   On: 03/19/2016 11:23     Assessment/Plan: 20 year old male with a history of chronic headaches that have recently worsened in severity but otherwise are not changed in character.  Normal neurological examination.  Head CT shows no acute changes.  Some sinus disease noted.  Suspect patient will require some prophylactic therapy but this would be better addressed as an outpatient.  With change in character further imaging would be appropriate as well.    Recommendations: 1.  MRI of the brain.  May be performed as an outpatient 2.  Ultram 50mg  prn headache 3.  Patient to follow up with neurology on an outpatient basis.  No further neurologic intervention is recommended at this time.  If further  questions arise, please call or page at that time.  Thank you for allowing neurology to participate in the care of this patient.   Thana FarrLeslie Canesha Tesfaye, MD Neurology [phone] 03/20/2016, 12:30 PM

## 2016-03-20 NOTE — Progress Notes (Signed)
Pedro Pugh is a 20 y.o. 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 Patient stated he had low-grade fever and some cough chest pain and headache. Echocardiogram showed left ventricular ejection fraction 50% which is kind of low compared to patient his age although still normal. There is no pericardial effusion and no wall motion abnormalities.   Review of Systems: No further chest pain but did have headache and fever and viral-type of syndrome   Past Medical History:  Diagnosis Date  . Seasonal allergies     Medications Prior to Admission  Medication Sig Dispense Refill  . meloxicam (MOBIC) 15 MG tablet Take 15 tablets by mouth daily.    . naproxen (EC NAPROSYN) 500 MG EC tablet Take 1 tablet (500 mg total) by mouth 2 (two) times daily with a meal. (Patient not taking: Reported on 03/19/2016) 30 tablet 0     . aspirin EC  325 mg Oral Q6H  . aspirin EC  81 mg Oral Daily  . carvedilol  6.25 mg Oral BID WC  . enoxaparin (LOVENOX) injection  40 mg Subcutaneous Q24H  . pantoprazole  40 mg Oral Daily  . sodium chloride flush  3 mL Intravenous Q12H  . sodium chloride flush  3 mL Intravenous Q12H    Infusions:   No Known Allergies  Social History   Social History  . Marital status: Single    Spouse name: N/A  . Number of children: N/A  . Years of education: N/A   Occupational History  . Not on file.   Social History Main Topics  . Smoking status: Never Smoker  . Smokeless tobacco: Never Used  . Alcohol use Not on file  . Drug use: Unknown  . Sexual activity: Not on file   Other Topics Concern  . Not on file   Social History Narrative  . No narrative on file    History reviewed. No pertinent family  history.  PHYSICAL EXAM: Vitals:   03/20/16 0716 03/20/16 1134  BP: 107/69 108/73  Pulse: 90 74  Resp: 18 18  Temp: 98.1 F (36.7 C) 97.6 F (36.4 C)     Intake/Output Summary (Last 24 hours) at 03/20/16 1323 Last data filed at 03/20/16 0900  Gross per 24 hour  Intake              360 ml  Output                0 ml  Net              360 ml    General:  Well appearing. No respiratory difficulty HEENT: normal Neck: supple. no JVD. Carotids 2+ bilat; no bruits. No lymphadenopathy or thryomegaly appreciated. Cor: PMI nondisplaced. Regular rate & rhythm. No rubs, gallops or murmurs. Lungs: clear Abdomen: soft, nontender, nondistended. No hepatosplenomegaly. No bruits or masses. Good bowel sounds. Extremities: no cyanosis, clubbing, rash, edema Neuro: alert & oriented x 3, cranial nerves grossly intact. moves all 4 extremities w/o difficulty. Affect pleasant.  WUJ:WJXBJY sinus rhythm with short PR interval and delta waves suggestive of possible WPW  Results for orders placed or performed during the hospital encounter of 03/19/16 (from the past 24 hour(s))  Troponin I  Status: Abnormal   Collection Time: 03/19/16  6:17 PM  Result Value Ref Range   Troponin I 0.32 (HH) <0.03 ng/mL  Troponin I     Status: Abnormal   Collection Time: 03/20/16 12:13 AM  Result Value Ref Range   Troponin I 0.21 (HH) <0.03 ng/mL  Basic metabolic panel     Status: None   Collection Time: 03/20/16  6:45 AM  Result Value Ref Range   Sodium 137 135 - 145 mmol/L   Potassium 3.9 3.5 - 5.1 mmol/L   Chloride 104 101 - 111 mmol/L   CO2 25 22 - 32 mmol/L   Glucose, Bld 90 65 - 99 mg/dL   BUN 11 6 - 20 mg/dL   Creatinine, Ser 0.980.84 0.61 - 1.24 mg/dL   Calcium 9.0 8.9 - 11.910.3 mg/dL   GFR calc non Af Amer >60 >60 mL/min   GFR calc Af Amer >60 >60 mL/min   Anion gap 8 5 - 15  CBC     Status: None   Collection Time: 03/20/16  6:45 AM  Result Value Ref Range   WBC 3.9 3.8 - 10.6 K/uL   RBC 4.84 4.40 -  5.90 MIL/uL   Hemoglobin 15.1 13.0 - 18.0 g/dL   HCT 14.742.2 82.940.0 - 56.252.0 %   MCV 87.3 80.0 - 100.0 fL   MCH 31.1 26.0 - 34.0 pg   MCHC 35.6 32.0 - 36.0 g/dL   RDW 13.012.4 86.511.5 - 78.414.5 %   Platelets 190 150 - 440 K/uL  Troponin I     Status: Abnormal   Collection Time: 03/20/16  6:45 AM  Result Value Ref Range   Troponin I 0.23 (HH) <0.03 ng/mL   Dg Chest 2 View  Result Date: 03/19/2016 CLINICAL DATA:  Central chest burning sensation associated with dizziness, shortness of breath, and vomiting since this morning. Nonsmoker. EXAM: CHEST  2 VIEW COMPARISON:  Chest x-ray of March 19, 2016 FINDINGS: The lungs are well-expanded and clear. The heart and pulmonary vascularity are normal. The mediastinum is normal in width. There is no pleural effusion, pneumothorax, or pneumomediastinum. The observed bony thorax is unremarkable. IMPRESSION: There is no active cardiopulmonary disease. Electronically Signed   By: David  SwazilandJordan M.D.   On: 03/19/2016 09:01   Ct Head Wo Contrast  Result Date: 03/19/2016 CLINICAL DATA:  Severe headache for 1 day with nausea and vomiting EXAM: CT HEAD WITHOUT CONTRAST TECHNIQUE: Contiguous axial images were obtained from the base of the skull through the vertex without intravenous contrast. COMPARISON:  None. FINDINGS: Brain: The ventricles are normal in size and configuration. There is no intracranial mass, hemorrhage, extra-axial fluid collection, or midline shift. Gray-white compartments appear normal. No acute infarct evident. Vascular: There is no hyperdense vessel. There are no vascular calcifications. Skull: The bony calvarium appears intact. Sinuses/Orbits: There is mild mucosal thickening in several ethmoid air cells bilaterally. Visualized paranasal sinuses elsewhere clear. Visualized orbits appear symmetric bilaterally. Other: Visualized mastoid air cells are clear bilaterally. IMPRESSION: Mild ethmoid air cell thickening at several sites. No intracranial mass hemorrhage,  or extra-axial fluid collection. Gray-white compartments appear normal. Electronically Signed   By: Bretta BangWilliam  Woodruff III M.D.   On: 03/19/2016 10:30   Ct Angio Chest Aorta W And/or Wo Contrast  Result Date: 03/19/2016 CLINICAL DATA:  Chest pain and shortness of breath EXAM: CT ANGIOGRAPHY CHEST WITH CONTRAST TECHNIQUE: Initially, axial CT images were obtained through the chest without intravenous contrast material administration. Multidetector CT imaging of the  chest was performed using the standard protocol during bolus administration of intravenous contrast. Multiplanar CT image reconstructions and MIPs were obtained to evaluate the vascular anatomy. CONTRAST:  75 mL Isovue 370 nonionic COMPARISON:  Chest radiograph March 19, 2016 FINDINGS: Cardiovascular: No intramural hematoma evident on noncontrast enhanced study. There is no thoracic aortic aneurysm or dissection. No ulceration or mucosal lesion is evident by CT in the thoracic aorta. The visualized great vessels appear unremarkable. The pericardium is not appreciably thickened. There is no demonstrable pulmonary embolus. Mediastinum/Nodes: Visualized thyroid appears normal. There is no appreciable thoracic adenopathy. Lungs/Pleura: Lungs are clear. No pleural effusion or pleural thickening. Upper Abdomen: In the visualized upper abdomen, the spleen is incompletely visualized. From anterior to posterior dimension, the spleen measures 14.6 cm in length which is enlarged. Visualized upper abdominal structures otherwise appear unremarkable. Musculoskeletal: There are no blastic or lytic bone lesions. No chest wall lesions are evident. Review of the MIP images confirms the above findings. IMPRESSION: No demonstrable thoracic aortic aneurysm or dissection. No pulmonary embolus demonstrable. Note that this study was timed to optimize aortic visualization as opposed to pulmonary arterial visualization. Lungs clear.  No evident adenopathy. Spleen incompletely  visualized. Spleen is felt to be enlarged based on anterior to posterior dimension of 14.6 cm. Electronically Signed   By: Bretta BangWilliam  Woodruff III M.D.   On: 03/19/2016 11:23     ASSESSMENT AND PLAN: Viral syndrome with possible viral myocarditis with borderline left reticular systolic function and mildly elevated troponin. Wall motion appears to be normal thus cardiac catheterization is not necessary. However advised admitting the patient on ace inhibitors or ARBS and carvedilol 6.25 twice a day. May go home with follow-up in the office Tuesday at 1:00. I will do CTA coronaries to make sure there is no evidence of CAD which is unlikely since patient is so young and has no history of cocaine or any other drug use.  Gaynelle Pastrana A

## 2016-03-21 NOTE — Discharge Summary (Signed)
[facility] [place] [facility]   PATIENT NAME: Pedro Pugh    MR#:  [id number]  DATE OF BIRTH:  03/06/1996  DATE OF ADMISSION:  03/19/2016   ADMITTING PHYSICIAN: Katharina Caperima Vaickute, MD  DATE OF DISCHARGE: 03/20/2016  4:00 PM  PRIMARY CARE PHYSICIAN: No PCP Per Patient   ADMISSION DIAGNOSIS:   Nonintractable headache, unspecified chronicity pattern, unspecified headache type [R51] Myocarditis, unspecified chronicity, unspecified myocarditis type (HCC) [I51.4]  DISCHARGE DIAGNOSIS:   Principal Problem:   Chest pain Active Problems:   Elevated troponin   Headache   Hyperglycemia   SECONDARY DIAGNOSIS:   Past Medical History:  Diagnosis Date  . Seasonal allergies     HOSPITAL COURSE:   20 year old male with past medical history significant for allergies and tension headaches comes to hospital secondary to headache and chest pain noted to have elevated troponins.  #1 chest pain with elevated troponins-troponins have plateaued. Chest pain has resolved. Received a dose of indomethacin. -Echocardiogram showing EF of 55% and grade 1 diastolic dysfunction. -Appreciate cardiology consult. Likely cause pericarditis with myocarditis. -No pericardial effusion noted on echocardiogram. -Blood pressure control advised and outpatient follow-up with cardiology recommended  #2 headaches-likely tension headaches and also patient has underlying hypertension issues -Seen by neurology as outpatient. Symptoms have resolved in the hospital. Recommended tramadol as needed here. MRI of the brain as outpatient and outpatient neurology follow-up if headaches continue to interfere.  #3 hypertension-started on Coreg and losartan low doses.  Clinically asymptomatic at this time and is being discharged home. Will follow-up with cardiology next week  DISCHARGE CONDITIONS:   Stable CONSULTS OBTAINED:   Treatment Team:  Kym GroomNeuro1 [facility], MD Laurier NancyShaukat A Khan, MD Thana FarrLeslie  Reynolds, MD  DRUG ALLERGIES:   No Known Allergies DISCHARGE MEDICATIONS:     Medication List    STOP taking these medications   meloxicam 15 MG tablet Commonly known as:  MOBIC   naproxen 500 MG EC tablet Commonly known as:  EC NAPROSYN     TAKE these medications   aspirin 81 MG EC tablet Take 1 tablet (81 mg total) by mouth daily.   carvedilol 6.25 MG tablet Commonly known as:  COREG Take 1 tablet (6.25 mg total) by mouth 2 (two) times daily with a meal.   losartan 25 MG tablet Commonly known as:  COZAAR Take 1 tablet (25 mg total) by mouth daily.   traMADol 50 MG tablet Commonly known as:  ULTRAM Take 1 tablet (50 mg total) by mouth every 8 (eight) hours as needed (headache).        DISCHARGE INSTRUCTIONS:   1. Cardiology follow up in 1 week 2. PCP follow-up in 1-2 weeks  DIET:   Regular diet  ACTIVITY:   Activity as tolerated  OXYGEN:   Home Oxygen: No.  Oxygen Delivery: room air  DISCHARGE LOCATION:   home   If you experience worsening of your admission symptoms, develop shortness of breath, life threatening emergency, suicidal or homicidal thoughts you must seek medical attention immediately by calling 911 or calling your MD immediately  if symptoms less severe.  You Must read complete instructions/literature along with all the possible adverse reactions/side effects for all the Medicines you take and that have been prescribed to you. Take any new Medicines after you have completely understood and accpet all the possible adverse reactions/side effects.   Please note  You were cared for by a hospitalist during your hospital stay. If you have any questions about  your discharge medications or the care you received while you were in the hospital after you are discharged, you can call the unit and asked to speak with the hospitalist on call if the hospitalist that took care of you is not available. Once you are discharged, your primary care physician  will handle any further medical issues. Please note that NO REFILLS for any discharge medications will be authorized once you are discharged, as it is imperative that you return to your primary care physician (or establish a relationship with a primary care physician if you do not have one) for your aftercare needs so that they can reassess your need for medications and monitor your lab values.    On the day of Discharge:  VITAL SIGNS:   Blood pressure 112/61, pulse 82, temperature 98.3 F (36.8 C), temperature source Oral, resp. rate 18, height 5\' 11"  (1.803 m), weight 79.4 kg (175 lb), SpO2 94 %.  PHYSICAL EXAMINATION:    GENERAL:  20 y.o.-year-old patient lying in the bed with no acute distress.  EYES: Pupils equal, round, reactive to light and accommodation. No scleral icterus. Extraocular muscles intact.  HEENT: Head atraumatic, normocephalic. Oropharynx and nasopharynx clear.  NECK:  Supple, no jugular venous distention. No thyroid enlargement, no tenderness.  LUNGS: Normal breath sounds bilaterally, no wheezing, rales,rhonchi or crepitation. No use of accessory muscles of respiration.  CARDIOVASCULAR: S1, S2 normal. No murmurs, rubs, or gallops.  ABDOMEN: Soft, non-tender, non-distended. Bowel sounds present. No organomegaly or mass.  EXTREMITIES: No pedal edema, cyanosis, or clubbing.  NEUROLOGIC: Cranial nerves II through XII are intact. Muscle strength 5/5 in all extremities. Sensation intact. Gait not checked.  PSYCHIATRIC: The patient is alert and oriented x 3.  SKIN: No obvious rash, lesion, or ulcer.   DATA REVIEW:   CBC  Recent Labs Lab 03/20/16 0645  WBC 3.9  HGB 15.1  HCT 42.2  PLT 190    Chemistries   Recent Labs Lab 03/20/16 0645  NA 137  K 3.9  CL 104  CO2 25  GLUCOSE 90  BUN 11  CREATININE 0.84  CALCIUM 9.0     Microbiology Results  No results found for this or any previous visit.  RADIOLOGY:  No results found.   Management plans  discussed with the patient, family and they are in agreement.  CODE STATUS:  Code Status History    Date Active Date Inactive Code Status Order ID Comments User Context   03/19/2016  3:59 PM 03/20/2016  8:26 PM Full Code [id number]  Katharina Caperima Vaickute, MD Inpatient      TOTAL TIME TAKING CARE OF THIS PATIENT: 37 minutes.    Cristy Colmenares M.D on 03/21/2016 at 1:12 PM  Between 7am to 6pm - Pager - [phone]  After 6pm go to [email or web address] - Scientist, research (life [email or web address])password [email or web address]  [facility]  Hospitalists  Office  [phone]  CC: Primary care physician; No PCP Per Patient   Note: This dictation was prepared with Dragon dictation along with smaller phrase technology. Any transcriptional errors that result from this process are unintentional.

## 2018-05-13 ENCOUNTER — Observation Stay (HOSPITAL_COMMUNITY)
Admission: EM | Admit: 2018-05-13 | Discharge: 2018-05-14 | Disposition: A | Discharge status: Home / Self Care | Attending: Internal Medicine | Admitting: Internal Medicine

## 2018-05-13 ENCOUNTER — Emergency Department (HOSPITAL_COMMUNITY)

## 2018-05-13 ENCOUNTER — Encounter (HOSPITAL_COMMUNITY): Payer: Self-pay | Admitting: Emergency Medicine

## 2018-05-13 ENCOUNTER — Other Ambulatory Visit: Payer: Self-pay

## 2018-05-13 DIAGNOSIS — R112 Nausea with vomiting, unspecified: Secondary | ICD-10-CM | POA: Diagnosis present

## 2018-05-13 DIAGNOSIS — R000 Tachycardia, unspecified: Secondary | ICD-10-CM | POA: Diagnosis present

## 2018-05-13 DIAGNOSIS — Z7982 Long term (current) use of aspirin: Secondary | ICD-10-CM | POA: Diagnosis not present

## 2018-05-13 DIAGNOSIS — K529 Noninfective gastroenteritis and colitis, unspecified: Principal | ICD-10-CM | POA: Insufficient documentation

## 2018-05-13 DIAGNOSIS — Z79899 Other long term (current) drug therapy: Secondary | ICD-10-CM | POA: Insufficient documentation

## 2018-05-13 DIAGNOSIS — E876 Hypokalemia: Secondary | ICD-10-CM | POA: Diagnosis not present

## 2018-05-13 DIAGNOSIS — R509 Fever, unspecified: Secondary | ICD-10-CM

## 2018-05-13 DIAGNOSIS — R1114 Bilious vomiting: Secondary | ICD-10-CM

## 2018-05-13 HISTORY — DX: Myocarditis, unspecified: I51.4

## 2018-05-13 LAB — COMPREHENSIVE METABOLIC PANEL
ALK PHOS: 32 U/L — AB (ref 38–126)
ALT: 12 U/L (ref 0–44)
AST: 32 U/L (ref 15–41)
Albumin: 4.5 g/dL (ref 3.5–5.0)
Anion gap: 7 (ref 5–15)
BILIRUBIN TOTAL: 1.8 mg/dL — AB (ref 0.3–1.2)
BUN: 14 mg/dL (ref 6–20)
CO2: 28 mmol/L (ref 22–32)
CREATININE: 0.94 mg/dL (ref 0.61–1.24)
Calcium: 9.4 mg/dL (ref 8.9–10.3)
Chloride: 102 mmol/L (ref 98–111)
Glucose, Bld: 110 mg/dL — ABNORMAL HIGH (ref 70–99)
Potassium: 4.6 mmol/L (ref 3.5–5.1)
Sodium: 137 mmol/L (ref 135–145)
TOTAL PROTEIN: 7.2 g/dL (ref 6.5–8.1)

## 2018-05-13 LAB — URINALYSIS, ROUTINE W REFLEX MICROSCOPIC
Bilirubin Urine: NEGATIVE
GLUCOSE, UA: NEGATIVE mg/dL
Hgb urine dipstick: NEGATIVE
Ketones, ur: NEGATIVE mg/dL
Leukocytes, UA: NEGATIVE
Nitrite: NEGATIVE
PH: 6 (ref 5.0–8.0)
Protein, ur: NEGATIVE mg/dL
SPECIFIC GRAVITY, URINE: 1.027 (ref 1.005–1.030)

## 2018-05-13 LAB — CBC WITH DIFFERENTIAL/PLATELET
Abs Immature Granulocytes: 0.02 10*3/uL (ref 0.00–0.07)
Basophils Absolute: 0 10*3/uL (ref 0.0–0.1)
Basophils Relative: 0 %
Eosinophils Absolute: 0 10*3/uL (ref 0.0–0.5)
Eosinophils Relative: 0 %
HCT: 51 % (ref 39.0–52.0)
HEMOGLOBIN: 17.2 g/dL — AB (ref 13.0–17.0)
Immature Granulocytes: 0 %
LYMPHS PCT: 3 %
Lymphs Abs: 0.3 10*3/uL — ABNORMAL LOW (ref 0.7–4.0)
MCH: 30.3 pg (ref 26.0–34.0)
MCHC: 33.7 g/dL (ref 30.0–36.0)
MCV: 89.8 fL (ref 80.0–100.0)
MONO ABS: 0.3 10*3/uL (ref 0.1–1.0)
Monocytes Relative: 3 %
Neutro Abs: 9.5 10*3/uL — ABNORMAL HIGH (ref 1.7–7.7)
Neutrophils Relative %: 94 %
Platelets: 250 10*3/uL (ref 150–400)
RBC: 5.68 MIL/uL (ref 4.22–5.81)
RDW: 12.2 % (ref 11.5–15.5)
WBC: 10.1 10*3/uL (ref 4.0–10.5)
nRBC: 0 % (ref 0.0–0.2)

## 2018-05-13 LAB — INFLUENZA PANEL BY PCR (TYPE A & B)
INFLBPCR: NEGATIVE
Influenza A By PCR: NEGATIVE

## 2018-05-13 LAB — D-DIMER, QUANTITATIVE: D-Dimer, Quant: <0.27 ug/mL-FEU (ref 0.00–0.50)

## 2018-05-13 LAB — LACTIC ACID, PLASMA: Lactic Acid, Venous: 1.2 mmol/L (ref 0.5–1.9)

## 2018-05-13 LAB — LIPASE, BLOOD: LIPASE: 23 U/L (ref 11–51)

## 2018-05-13 MED ORDER — SODIUM CHLORIDE 0.9 % IV SOLN
INTRAVENOUS | Status: DC
  Administered 2018-05-13: 21:00:00 via INTRAVENOUS

## 2018-05-13 MED ORDER — SODIUM CHLORIDE 0.9 % IV BOLUS
1000.0000 mL | Freq: Once | INTRAVENOUS | Status: AC
  Administered 2018-05-13: 1000 mL via INTRAVENOUS

## 2018-05-13 MED ORDER — ONDANSETRON HCL 4 MG/2ML IJ SOLN
4.0000 mg | Freq: Once | INTRAMUSCULAR | Status: AC
  Administered 2018-05-13: 4 mg via INTRAVENOUS
  Filled 2018-05-13: qty 2

## 2018-05-13 MED ORDER — IOHEXOL 300 MG/ML  SOLN
100.0000 mL | Freq: Once | INTRAMUSCULAR | Status: AC | PRN
  Administered 2018-05-13: 100 mL via INTRAVENOUS

## 2018-05-13 MED ORDER — ENOXAPARIN SODIUM 40 MG/0.4ML ~~LOC~~ SOLN
40.0000 mg | SUBCUTANEOUS | Status: DC
  Filled 2018-05-13: qty 0.4

## 2018-05-13 MED ORDER — ACETAMINOPHEN 650 MG RE SUPP: 650.0000 mg | Freq: Four times a day (QID) | RECTAL | Status: DC | PRN

## 2018-05-13 MED ORDER — ONDANSETRON HCL 4 MG PO TABS: 4.0000 mg | ORAL_TABLET | Freq: Four times a day (QID) | ORAL | Status: DC | PRN

## 2018-05-13 MED ORDER — ACETAMINOPHEN 325 MG PO TABS
650.0000 mg | ORAL_TABLET | Freq: Once | ORAL | Status: AC
  Administered 2018-05-13: 650 mg via ORAL
  Filled 2018-05-13: qty 2

## 2018-05-13 MED ORDER — ACETAMINOPHEN 325 MG PO TABS
650.0000 mg | ORAL_TABLET | Freq: Four times a day (QID) | ORAL | Status: DC | PRN
  Administered 2018-05-14: 650 mg via ORAL
  Filled 2018-05-13: qty 2

## 2018-05-13 MED ORDER — SODIUM CHLORIDE 0.9% FLUSH: 3.0000 mL | Freq: Two times a day (BID) | INTRAVENOUS | Status: DC

## 2018-05-13 MED ORDER — ONDANSETRON HCL 4 MG/2ML IJ SOLN
4.0000 mg | Freq: Four times a day (QID) | INTRAMUSCULAR | Status: DC | PRN
  Administered 2018-05-14: 4 mg via INTRAVENOUS
  Filled 2018-05-13: qty 2

## 2018-05-13 NOTE — ED Triage Notes (Signed)
Pt. Stated, I started yesterday with stomach pain and then early this morning I started having N/V

## 2018-05-13 NOTE — ED Notes (Signed)
Advised Dr. Rosalia Hammers pts temp 100.8 oral.

## 2018-05-13 NOTE — ED Provider Notes (Signed)
[facility] EMERGENCY DEPARTMENT Provider Note   CSN: [id number] Arrival date & time: 05/13/18  1354     History   Chief Complaint Chief Complaint  Patient presents with  . Abdominal Pain  . Nausea  . Emesis    HPI Pedro Pugh is a 23 y.o. male.  HPI 23 year old male comes in today complaining of abdominal pain began last night.  He states this morning he woke up with nausea and vomiting.  He is continued to have abdominal pain.  Has been generalized in nature.  Has a poor appetite.  Has not had similar symptoms in the past.  He also reports he has had some headache.  However, he states he does get headaches and this feels worse than his usual headache.  He just feels it may be related to some volume depletion as he has been unable to drink any fluids today.  When he tried to eat pretzels earlier he vomited.  He has not had any sick contacts at home.  However, he has recently returned from [place] where he was on a educational trip. Past Medical History:  Diagnosis Date  . Seasonal allergies     Patient Active Problem List   Diagnosis Date Noted  . Chest pain 03/19/2016  . Elevated troponin 03/19/2016  . Headache 03/19/2016  . Hyperglycemia 03/19/2016    History reviewed. No pertinent surgical history.      Home Medications    Prior to Admission medications   Medication Sig Start Date End Date Taking? Authorizing Provider  aspirin EC 81 MG EC tablet Take 1 tablet (81 mg total) by mouth daily. 03/21/16   Enid Baas, MD  carvedilol (COREG) 6.25 MG tablet Take 1 tablet (6.25 mg total) by mouth 2 (two) times daily with a meal. 03/20/16   Enid Baas, MD  losartan (COZAAR) 25 MG tablet Take 1 tablet (25 mg total) by mouth daily. 03/20/16   Enid Baas, MD  traMADol (ULTRAM) 50 MG tablet Take 1 tablet (50 mg total) by mouth every 8 (eight) hours as needed (headache). 03/20/16   Enid Baas, MD    Family History No family  history on file.  Social History Social History   Tobacco Use  . Smoking status: Never Smoker  . Smokeless tobacco: Never Used  Substance Use Topics  . Alcohol use: Not on file  . Drug use: Not on file     Allergies   Patient has no known allergies.   Review of Systems Review of Systems  All other systems reviewed and are negative.    Physical Exam Updated Vital Signs BP (!) 147/84 (BP Location: Right Arm)   Pulse (!) 120   Temp 98.2 F (36.8 C) (Oral)   Resp 18   Ht 1.803 m (5\' 11" )   Wt 74.8 kg   SpO2 96%   BMI 23.01 kg/m   Physical Exam Vitals signs and nursing note reviewed.  Constitutional:      General: He is not in acute distress.    Appearance: He is well-developed and normal weight. He is not ill-appearing.  HENT:     Head: Normocephalic and atraumatic.     Mouth/Throat:     Mouth: Mucous membranes are moist.     Pharynx: Oropharynx is clear.  Eyes:     Extraocular Movements: Extraocular movements intact.     Pupils: Pupils are equal, round, and reactive to light.  Neck:     Musculoskeletal: Normal range of motion  and neck supple.  Cardiovascular:     Rate and Rhythm: Tachycardia present.     Pulses: Normal pulses.  Pulmonary:     Effort: Pulmonary effort is normal.     Breath sounds: Normal breath sounds.  Abdominal:     General: Abdomen is flat. Bowel sounds are normal. There is no distension.     Palpations: Abdomen is soft. There is no mass.     Tenderness: There is abdominal tenderness.  Musculoskeletal: Normal range of motion.  Skin:    General: Skin is warm and dry.     Capillary Refill: Capillary refill takes less than 2 seconds.  Neurological:     General: No focal deficit present.     Mental Status: He is alert and oriented to person, place, and time.     Cranial Nerves: No cranial nerve deficit.     Motor: No weakness.  Psychiatric:        Mood and Affect: Mood normal.        Behavior: Behavior normal.      ED  Treatments / Results  Labs (all labs ordered are listed, but only abnormal results are displayed) Labs Reviewed  COMPREHENSIVE METABOLIC PANEL  LIPASE, BLOOD  CBC WITH DIFFERENTIAL/PLATELET  URINALYSIS, ROUTINE W REFLEX MICROSCOPIC  LACTIC ACID, PLASMA    EKG EKG Interpretation  Date/Time:  Saturday May 13 2018 14:38:09 EST Ventricular Rate:  113 PR Interval:  112 QRS Duration: 88 QT Interval:  300 QTC Calculation: 411 R Axis:   75 Text Interpretation:  Sinus tachycardia T wave abnormality, consider inferior ischemia Abnormal ECG Confirmed by Margarita Grizzleay, Quinnlyn Hearns [phone]([id number]) on 05/13/2018 2:45:48 PM   Radiology Ct Abdomen Pelvis W Contrast  Result Date: 05/13/2018 CLINICAL DATA:  Nausea and vomiting along with abdominal pain EXAM: CT ABDOMEN AND PELVIS WITH CONTRAST TECHNIQUE: Multidetector CT imaging of the abdomen and pelvis was performed using the standard protocol following bolus administration of intravenous contrast. CONTRAST:  100mL OMNIPAQUE IOHEXOL 300 MG/ML  SOLN COMPARISON:  None. FINDINGS: Lower chest: Trace pericardial effusion. Hepatobiliary: Unremarkable Pancreas: Unremarkable Spleen: Unremarkable Adrenals/Urinary Tract: Unremarkable Stomach/Bowel: Upper normal amount of stool in the colon. Normal appendix. No dilated bowel. Vascular/Lymphatic: Unremarkable Reproductive: Unremarkable Other: No supplemental non-categorized findings. Musculoskeletal: Unremarkable IMPRESSION: 1. A specific cause for the patient's symptoms is not identified. There is no dilated bowel to suggest obstruction. 2. Questionable trace pericardial effusion anteriorly. Electronically Signed   By: Gaylyn RongWalter  Liebkemann M.D.   On: 05/13/2018 16:50    Procedures Procedures (including critical care time)  Medications Ordered in ED Medications  sodium chloride 0.9 % bolus 1,000 mL (has no administration in time range)     Initial Impression / Assessment and Plan / ED Course  I have reviewed the triage  vital signs and the nursing notes.  Pertinent labs & imaging results that were available during my care of the patient were reviewed by me and considered in my medical decision making (see chart for details).   23 year old male presents today complaining abdominal pain nausea and vomiting.  Pain is diffuse with more pain on the right side.  CT reveals no evidence of acute intra-abdominal cause of pain.  Labs show some mild increase in bilirubin at 1.8 with upper limit of normal being 1.2.  Transaminases are normal.  Lipase is normal. 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  Today he is tachycardic but temp has increased to one 1.3.  He is receiving IV fluids  and antipyretics.  He had one blood pressure that was low at 94/53 but I suspect this was an aberrant blood pressure is repeat immediately should 120/80 without intervention and patient appears well.  He is awake alert talkative and interactive.  Lactic acid  obtained is 1.2.  Repeat temperature 99.4.  Heart rate 128 130.  I stood patient his heart rate went up to 150.  His blood pressure is stable at 130/80.  Given patient's ongoing tachycardia.  Plan admission for further treatment and evaluation. Discussed with Dr. Allena Katz.  Given patient's recent travel and tachycardia will add d-dimer although patient is not complaining of dyspnea or other chest complaints. Final Clinical Impressions(s) / ED Diagnoses   Final diagnoses:  Nausea and vomiting, intractability of vomiting not specified, unspecified vomiting type  Febrile illness  Tachycardia    ED Discharge Orders    None       Margarita Grizzle, MD 05/13/18 [phone]

## 2018-05-13 NOTE — ED Notes (Signed)
Report attempted 

## 2018-05-13 NOTE — ED Notes (Signed)
Pt would like to wait for zofran to take affect before taking tylenol

## 2018-05-13 NOTE — H&P (Signed)
History and Physical    Pedro Pugh SVX:[id number] DOB: 01/12/1996 DOA: 05/13/2018  PCP: Patient, No Pcp Per  Patient coming from: Home  I have personally briefly reviewed patient's old medical records in [place]  Chief Complaint: Nausea vomiting  HPI: Pedro Pugh is a 23 y.o. male with medical history significant for seasonal allergies and prior pericarditis with myocarditis who presents the ED with 1 day of sudden onset nausea and vomiting associated with right-sided abdominal pain and fever.  He reports frequent emesis immediately after oral intake over the last 24 hours, initially regurgitating food and nonbilious emesis.  He denies any hematemesis.  He reports associated diaphoresis with his emesis.  He reports recent travel from [place] 1 week ago.  He denies any diarrhea, chest pain, palpitations, dyspnea, edema, rash, cough.  Reports occasional alcohol use with 1-3 beers every other day without any recent increase in intake.  He denies any illicit drug use or tobacco use.  He had a prior admission in 03/2016 for unspecified pericarditis/myocarditis and has been doing well since then and reports a normal stress test at that time.  He is not on any medications on seasonal allergy medications.  ED Course:  Initial vitals showed BP 147/84, pulse 120, RR 18, temp 98.2 Fahrenheit to one 1.3 Fahrenheit and SPO2 96% on room air.  Labs show WBC 10.1, hemoglobin 17.2, platelets 250, sodium 137, potassium 4.6, BUN 14, creatinine 0.94, AST 32, ALT 12, alk phos 32, T bili 1.8, lipase 23, d-dimer <0.27, negative influenza panel.  Urinalysis negative for UTI.  CT abdomen/Pelvis with contrast was negative for acute intra-abdominal abnormality, questionable trace pericardial effusion anteriorly was noted.  EKG shows sinus tachycardia with TWI in the inferior leads, doing lead II and aVF compared to prior.  Patient was given 3 L normal saline and IV Zofran with continued nausea and  vomiting.  The hospitalist service was consulted to admit for further evaluation management.  Review of Systems: As per HPI otherwise 10 point review of systems negative.    Past Medical History:  Diagnosis Date  . Myocarditis ([place])   . Myocarditis ([place])   . Seasonal allergies     History reviewed. No pertinent surgical history.   reports that he has never smoked. He has never used smokeless tobacco. He reports current alcohol use. He reports that he does not use drugs.  No Known Allergies  History reviewed. No pertinent family history.   Prior to Admission medications   Medication Sig Start Date End Date Taking? Authorizing Provider  aspirin EC 81 MG EC tablet Take 1 tablet (81 mg total) by mouth daily. 03/21/16   Gladstone Lighter, MD  carvedilol (COREG) 6.25 MG tablet Take 1 tablet (6.25 mg total) by mouth 2 (two) times daily with a meal. 03/20/16   Gladstone Lighter, MD  losartan (COZAAR) 25 MG tablet Take 1 tablet (25 mg total) by mouth daily. 03/20/16   Gladstone Lighter, MD  traMADol (ULTRAM) 50 MG tablet Take 1 tablet (50 mg total) by mouth every 8 (eight) hours as needed (headache). 03/20/16   Gladstone Lighter, MD    Physical Exam: Vitals:   05/13/18 1937 05/13/18 2003 05/13/18 2300 05/14/18 0010  BP:  112/68  119/71  Pulse: (!) 115 (!) 105  (!) 120  Resp:  18  18  Temp: (!) 100.7 F (38.2 C) 98.8 F (37.1 C)  (!) 101.1 F (38.4 C)  TempSrc: Oral Oral  Oral  SpO2:  99%  100% 100%  Weight:      Height:        Constitutional: NAD, calm, comfortable Eyes: PERRL, lids and conjunctivae normal ENMT: Mucous membranes are moist. Posterior pharynx clear of any exudate or lesions. Normal dentition.  Neck: normal, supple, no masses. Respiratory: clear to auscultation bilaterally, no wheezing, no crackles. Normal respiratory effort. No accessory muscle use.  Cardiovascular: Tachycardic, no murmurs / rubs / gallops. No extremity edema. Abdomen: no tenderness, no masses  palpated. No hepatosplenomegaly. Bowel sounds positive.  Musculoskeletal: no clubbing / cyanosis. No joint deformity upper and lower extremities. Good ROM, no contractures. Normal muscle tone.  Skin: no rashes, lesions, ulcers. No induration Neurologic: CN 2-12 grossly intact. Sensation intact, Strength 5/5 in all 4.  Psychiatric: Normal judgment and insight. Alert and oriented x 3. Normal mood.   Labs on Admission: I have personally reviewed following labs and imaging studies  CBC: Recent Labs  Lab 05/13/18 1442  WBC 10.1  NEUTROABS 9.5*  HGB 17.2*  HCT 51.0  MCV 89.8  PLT 825   Basic Metabolic Panel: Recent Labs  Lab 05/13/18 1442  NA 137  K 4.6  CL 102  CO2 28  GLUCOSE 110*  BUN 14  CREATININE 0.94  CALCIUM 9.4   GFR: Estimated Creatinine Clearance: 129.3 mL/min (by C-G formula based on SCr of 0.94 mg/dL). Liver Function Tests: Recent Labs  Lab 05/13/18 1442  AST 32  ALT 12  ALKPHOS 32*  BILITOT 1.8*  PROT 7.2  ALBUMIN 4.5   Recent Labs  Lab 05/13/18 1442  LIPASE 23   No results for input(s): AMMONIA in the last 168 hours. Coagulation Profile: No results for input(s): INR, PROTIME in the last 168 hours. Cardiac Enzymes: No results for input(s): CKTOTAL, CKMB, CKMBINDEX, TROPONINI in the last 168 hours. BNP (last 3 results) No results for input(s): PROBNP in the last 8760 hours. HbA1C: No results for input(s): HGBA1C in the last 72 hours. CBG: No results for input(s): GLUCAP in the last 168 hours. Lipid Profile: No results for input(s): CHOL, HDL, LDLCALC, TRIG, CHOLHDL, LDLDIRECT in the last 72 hours. Thyroid Function Tests: No results for input(s): TSH, T4TOTAL, FREET4, T3FREE, THYROIDAB in the last 72 hours. Anemia Panel: No results for input(s): VITAMINB12, FOLATE, FERRITIN, TIBC, IRON, RETICCTPCT in the last 72 hours. Urine analysis:    Component Value Date/Time   COLORURINE YELLOW 05/13/2018 1426   APPEARANCEUR CLEAR 05/13/2018 1426    LABSPEC 1.027 05/13/2018 1426   PHURINE 6.0 05/13/2018 1426   GLUCOSEU NEGATIVE 05/13/2018 1426   HGBUR NEGATIVE 05/13/2018 1426   BILIRUBINUR NEGATIVE 05/13/2018 1426   KETONESUR NEGATIVE 05/13/2018 1426   PROTEINUR NEGATIVE 05/13/2018 1426   NITRITE NEGATIVE 05/13/2018 1426   LEUKOCYTESUR NEGATIVE 05/13/2018 1426    Radiological Exams on Admission: Ct Abdomen Pelvis W Contrast  Result Date: 05/13/2018 CLINICAL DATA:  Nausea and vomiting along with abdominal pain EXAM: CT ABDOMEN AND PELVIS WITH CONTRAST TECHNIQUE: Multidetector CT imaging of the abdomen and pelvis was performed using the standard protocol following bolus administration of intravenous contrast. CONTRAST:  172m OMNIPAQUE IOHEXOL 300 MG/ML  SOLN COMPARISON:  None. FINDINGS: Lower chest: Trace pericardial effusion. Hepatobiliary: Unremarkable Pancreas: Unremarkable Spleen: Unremarkable Adrenals/Urinary Tract: Unremarkable Stomach/Bowel: Upper normal amount of stool in the colon. Normal appendix. No dilated bowel. Vascular/Lymphatic: Unremarkable Reproductive: Unremarkable Other: No supplemental non-categorized findings. Musculoskeletal: Unremarkable IMPRESSION: 1. A specific cause for the patient's symptoms is not identified. There is no dilated bowel to suggest obstruction. 2.  Questionable trace pericardial effusion anteriorly. Electronically Signed   By: Van Clines M.D.   On: 05/13/2018 16:50    EKG: Independently reviewed. EKG shows sinus tachycardia with TWI in the inferior leads, doing lead II and aVF compared to prior.  Assessment/Plan Active Problems:   Nausea and vomiting  Pedro Pugh is a 23 y.o. male with medical history significant for seasonal allergies and prior pericarditis with myocarditis who presents the ED with 1 day of sudden onset nausea and vomiting associated with right-sided abdominal pain and fever   Nausea/vomiting/abdominal pain: Suspect secondary to viral illness.  CT abdomen/pelvis  without acute abnormality.  He has continued tachycardia likely from volume depletion with frequent emesis. -IV fluids overnight -Antiemetics and Tylenol PRN -Advance diet as tolerated   DVT prophylaxis: Lovenox Code Status: Full code Family Communication: Discussed with significant other at bedside Disposition Plan: Likely discharge to home in 1 day Consults called: None Admission status: Observation   Zada Finders MD [facility] Pager [phone]  If 7PM-7AM, please contact night-coverage [email or web address]  05/14/2018, 12:35 AM

## 2018-05-14 ENCOUNTER — Encounter: Payer: Self-pay | Admitting: Internal Medicine

## 2018-05-14 DIAGNOSIS — R112 Nausea with vomiting, unspecified: Secondary | ICD-10-CM

## 2018-05-14 LAB — BASIC METABOLIC PANEL
Anion gap: 10 (ref 5–15)
BUN: 9 mg/dL (ref 6–20)
CO2: 23 mmol/L (ref 22–32)
Calcium: 7.9 mg/dL — ABNORMAL LOW (ref 8.9–10.3)
Chloride: 105 mmol/L (ref 98–111)
Creatinine, Ser: 0.79 mg/dL (ref 0.61–1.24)
GFR calc Af Amer: >60 mL/min (ref >60)
GFR calc non Af Amer: >60 mL/min (ref >60)
Glucose, Bld: 107 mg/dL — ABNORMAL HIGH (ref 70–99)
Potassium: 3.1 mmol/L — ABNORMAL LOW (ref 3.5–5.1)
Sodium: 138 mmol/L (ref 135–145)

## 2018-05-14 LAB — CBC
HEMATOCRIT: 40.4 % (ref 39.0–52.0)
Hemoglobin: 13.5 g/dL (ref 13.0–17.0)
MCH: 30.2 pg (ref 26.0–34.0)
MCHC: 33.4 g/dL (ref 30.0–36.0)
MCV: 90.4 fL (ref 80.0–100.0)
Platelets: 187 10*3/uL (ref 150–400)
RBC: 4.47 MIL/uL (ref 4.22–5.81)
RDW: 12.2 % (ref 11.5–15.5)
WBC: 4.5 10*3/uL (ref 4.0–10.5)
nRBC: 0 % (ref 0.0–0.2)

## 2018-05-14 LAB — HIV ANTIBODY (ROUTINE TESTING W REFLEX): HIV Screen 4th Generation wRfx: NONREACTIVE

## 2018-05-14 MED ORDER — ONDANSETRON 4 MG PO TBDP: 4.0000 mg | ORAL_TABLET | Freq: Three times a day (TID) | ORAL | 0 refills | Status: AC | PRN

## 2018-05-14 MED ORDER — POTASSIUM CHLORIDE CRYS ER 20 MEQ PO TBCR
40.0000 meq | EXTENDED_RELEASE_TABLET | Freq: Once | ORAL | Status: AC
  Administered 2018-05-14: 40 meq via ORAL
  Filled 2018-05-14: qty 2

## 2018-05-14 MED ORDER — POTASSIUM CHLORIDE IN NACL 40-0.9 MEQ/L-% IV SOLN
INTRAVENOUS | Status: DC
  Filled 2018-05-14: qty 1000

## 2018-05-14 NOTE — Discharge Summary (Signed)
Physician Discharge Summary  Pedro Pugh KHT:[id number] DOB: Dec 14, 1995 DOA: 05/13/2018  PCP: Patient, No Pcp Per  Admit date: 05/13/2018 Discharge date: 05/14/2018  Admitted From: home Discharge disposition: home   Recommendations for Outpatient Follow-Up:      Discharge Diagnosis:   Active Problems:   Nausea and vomiting    Discharge Condition: Improved.  Diet recommendation: regular  Wound care: None.  Code status: Full.   History of Present Illness:   Pedro Pugh is a 23 y.o. male with medical history significant for seasonal allergies and prior pericarditis with myocarditis who presents the ED with 1 day of sudden onset nausea and vomiting associated with right-sided abdominal pain and fever.  He reports frequent emesis immediately after oral intake over the last 24 hours, initially regurgitating food and nonbilious emesis.  He denies any hematemesis.  He reports associated diaphoresis with his emesis.  He reports recent travel from [place] 1 week ago.  He denies any diarrhea, chest pain, palpitations, dyspnea, edema, rash, cough.   Hospital Course by Problem:   Gastroenteritis -symptoms resolved -PRN zofran to have at home  Hypokalemia -repleted    Medical Consultants:      Discharge Exam:   Vitals:   05/14/18 0225 05/14/18 0559  BP:  117/72  Pulse:  96  Resp:  18  Temp: 99 F (37.2 C) 99 F (37.2 C)  SpO2:  100%   Vitals:   05/13/18 2300 05/14/18 0010 05/14/18 0225 05/14/18 0559  BP:  119/71  117/72  Pulse:  (!) 120  96  Resp:  18  18  Temp:  (!) 101.1 F (38.4 C) 99 F (37.2 C) 99 F (37.2 C)  TempSrc:  Oral  Oral  SpO2: 100% 100%  100%  Weight:      Height:        General exam: Appears calm and comfortable.  Eating well  The results of significant diagnostics from this hospitalization (including imaging, microbiology, ancillary and laboratory) are listed below for reference.     Procedures and Diagnostic Studies:     Ct Abdomen Pelvis W Contrast  Result Date: 05/13/2018 CLINICAL DATA:  Nausea and vomiting along with abdominal pain EXAM: CT ABDOMEN AND PELVIS WITH CONTRAST TECHNIQUE: Multidetector CT imaging of the abdomen and pelvis was performed using the standard protocol following bolus administration of intravenous contrast. CONTRAST:  OMNIPAQUE IOHEXOL 300 MG/ML  SOLN COMPARISON:  None. FINDINGS: Lower chest: Trace pericardial effusion. Hepatobiliary: Unremarkable Pancreas: Unremarkable Spleen: Unremarkable Adrenals/Urinary Tract: Unremarkable Stomach/Bowel: Upper normal amount of stool in the colon. Normal appendix. No dilated bowel. Vascular/Lymphatic: Unremarkable Reproductive: Unremarkable Other: No supplemental non-categorized findings. Musculoskeletal: Unremarkable IMPRESSION: 1. A specific cause for the patient's symptoms is not identified. There is no dilated bowel to suggest obstruction. 2. Questionable trace pericardial effusion anteriorly. Electronically Signed   By: Gaylyn Rong M.D.   On: 05/13/2018 16:50     Labs:   Basic Metabolic Panel: Recent Labs  Lab 05/13/18 1442 05/14/18 0244  NA 137 138  K 4.6 3.1*  CL 102 105  CO2 28 23  GLUCOSE 110* 107*  BUN 14 9  CREATININE 0.94 0.79  CALCIUM 9.4 7.9*   GFR Estimated Creatinine Clearance: 151.9 mL/min (by C-G formula based on SCr of 0.79 mg/dL). Liver Function Tests: Recent Labs  Lab 05/13/18 1442  AST 32  ALT 12  ALKPHOS 32*  BILITOT 1.8*  PROT 7.2  ALBUMIN 4.5   Recent Labs  Lab 05/13/18 1442  LIPASE 23   No results for input(s): AMMONIA in the last 168 hours. Coagulation profile No results for input(s): INR, PROTIME in the last 168 hours.  CBC: Recent Labs  Lab 05/13/18 1442 05/14/18 0244  WBC 10.1 4.5  NEUTROABS 9.5*  --   HGB 17.2* 13.5  HCT 51.0 40.4  MCV 89.8 90.4  PLT 250 187   Cardiac Enzymes: No results for input(s): CKTOTAL, CKMB, CKMBINDEX, TROPONINI in the last 168  hours. BNP: Invalid input(s): POCBNP CBG: No results for input(s): GLUCAP in the last 168 hours. D-Dimer Recent Labs    05/13/18 1839  DDIMER <0.27   Hgb A1c No results for input(s): HGBA1C in the last 72 hours. Lipid Profile No results for input(s): CHOL, HDL, LDLCALC, TRIG, CHOLHDL, LDLDIRECT in the last 72 hours. Thyroid function studies No results for input(s): TSH, T4TOTAL, T3FREE, THYROIDAB in the last 72 hours.  Invalid input(s): FREET3 Anemia work up No results for input(s): VITAMINB12, FOLATE, FERRITIN, TIBC, IRON, RETICCTPCT in the last 72 hours. Microbiology No results found for this or any previous visit (from the past 240 hour(s)).   Discharge Instructions:   Discharge Instructions    Increase activity slowly   Complete by:  As directed      Allergies as of 05/14/2018   No Known Allergies     Medication List    STOP taking these medications   aspirin 81 MG EC tablet   carvedilol 6.25 MG tablet Commonly known as:  COREG   losartan 25 MG tablet Commonly known as:  COZAAR   traMADol 50 MG tablet Commonly known as:  ULTRAM     TAKE these medications   ondansetron 4 MG disintegrating tablet Commonly known as:  ZOFRAN ODT Take 1 tablet (4 mg total) by mouth every 8 (eight) hours as needed for nausea or vomiting.         Time coordinating discharge: 25 min  Signed:  Joseph Art DO  [facility] 05/14/2018, 9:14 AM

## 2019-10-30 IMAGING — CT CT ABD-PELV W/ CM
2 of 4 series · 16 of 46 positions shown, 18 images · IV contrast (omnipaque)
Comparison: None.

CLINICAL DATA: Nausea and vomiting along with abdominal pain

EXAM:
CT ABDOMEN AND PELVIS WITH CONTRAST
TECHNIQUE: Multidetector CT imaging of the abdomen and pelvis was performed
using the standard protocol following bolus administration of
intravenous contrast.
CONTRAST:  100mL OMNIPAQUE IOHEXOL 300 MG/ML  SOLN

[Series 3: abd/ pelvis 5.0 i30f 2 · axial · 0.64mm/px · z∈[+709,+1159]mm · 13 of 98 slices shown, 15 images]
[im 4/98  soft-tissue]
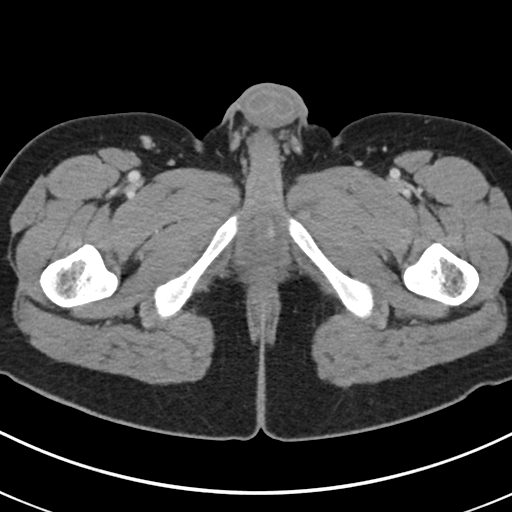
[im 4/98  bone]
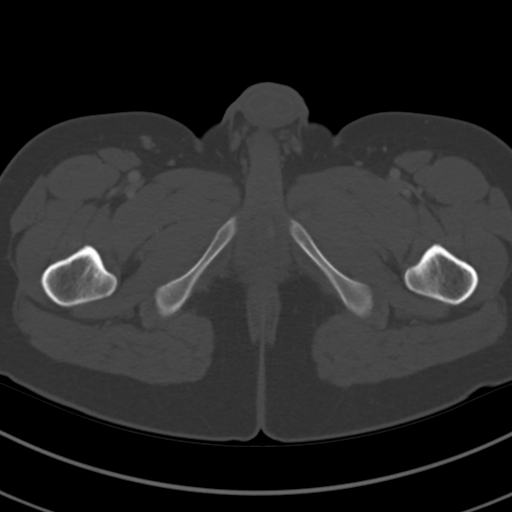
[im 12/98  soft-tissue]
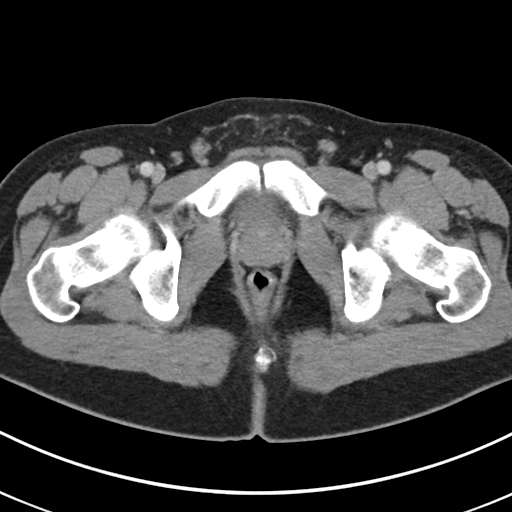
[im 19/98  soft-tissue]
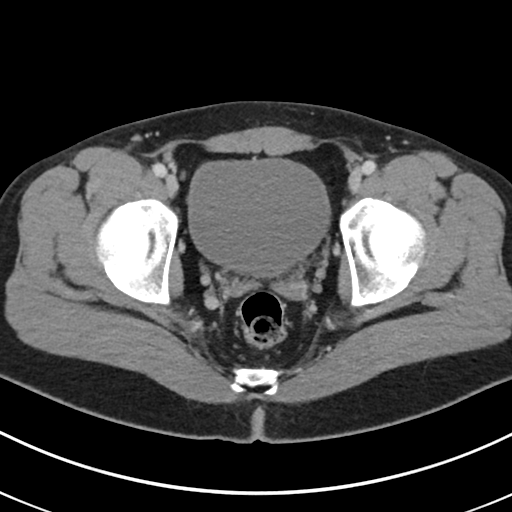
[im 27/98  soft-tissue]
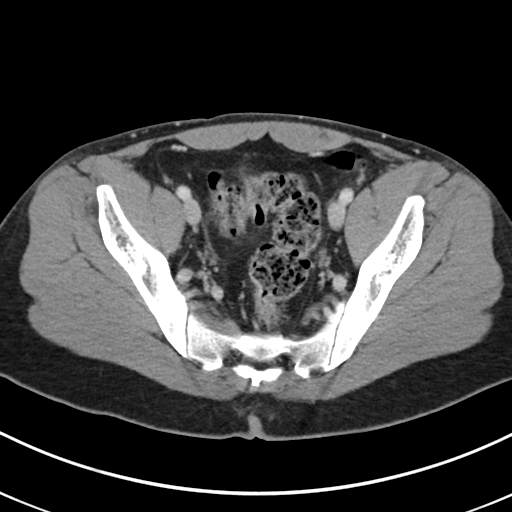
[im 34/98  soft-tissue]
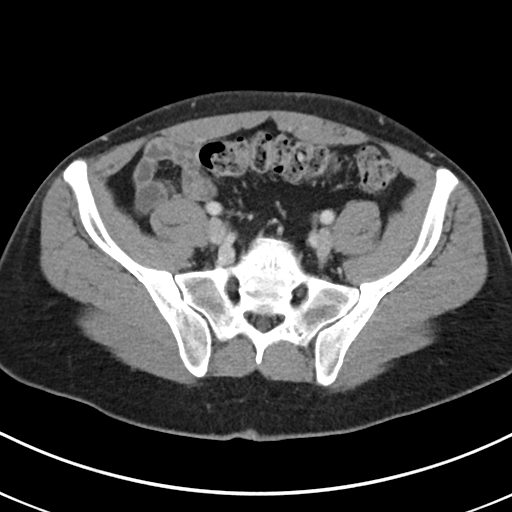
[im 42/98  soft-tissue]
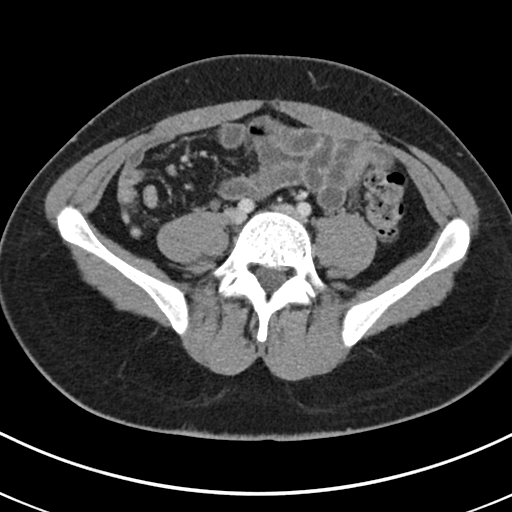
[im 49/98  soft-tissue]
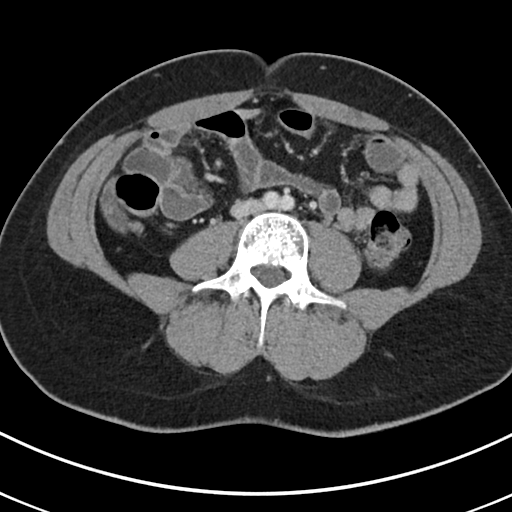
[im 56/98  soft-tissue]
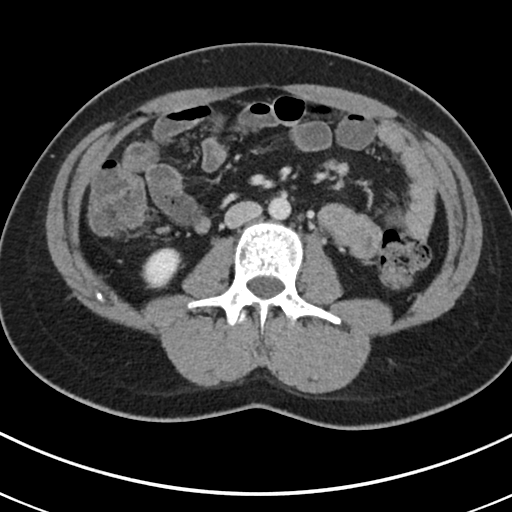
[im 64/98  soft-tissue]
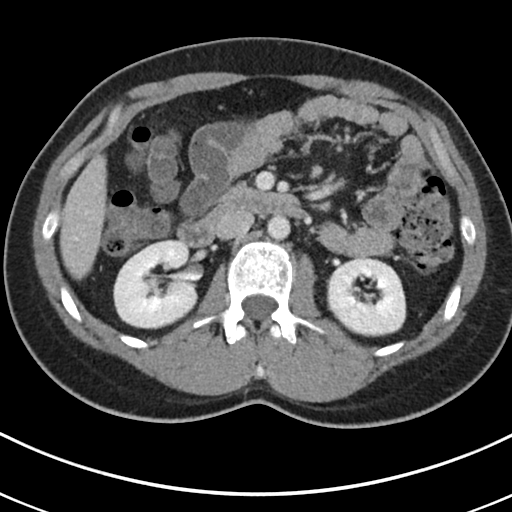
[im 64/98  bone]
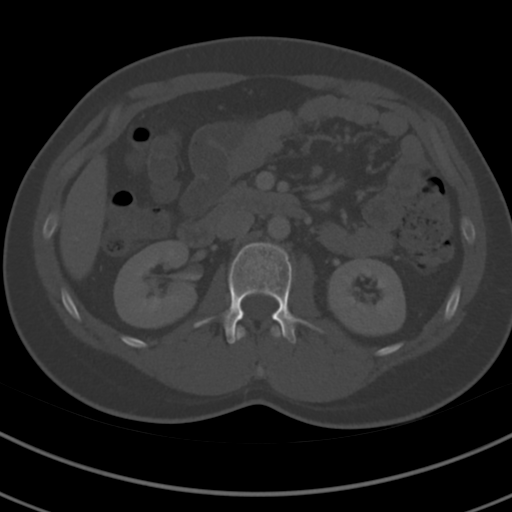
[im 71/98  soft-tissue]
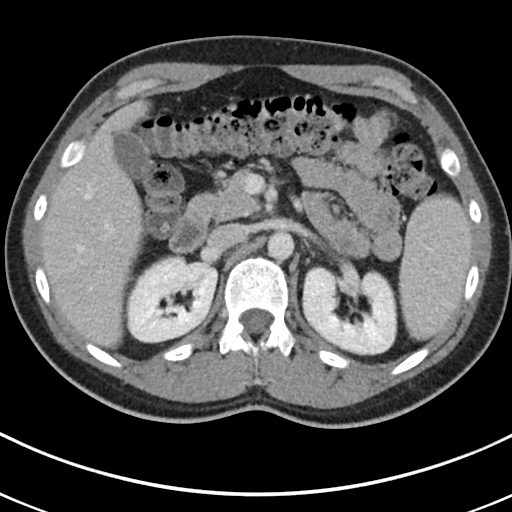
[im 79/98  soft-tissue]
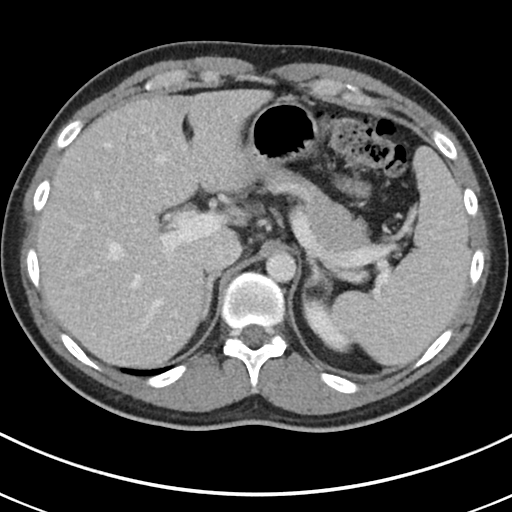
[im 86/98  soft-tissue]
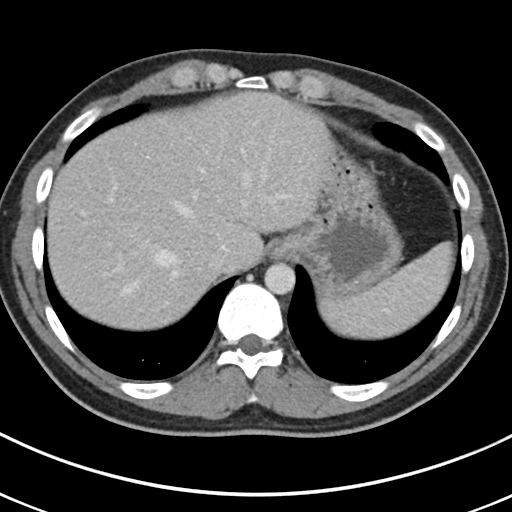
[im 94/98  soft-tissue]
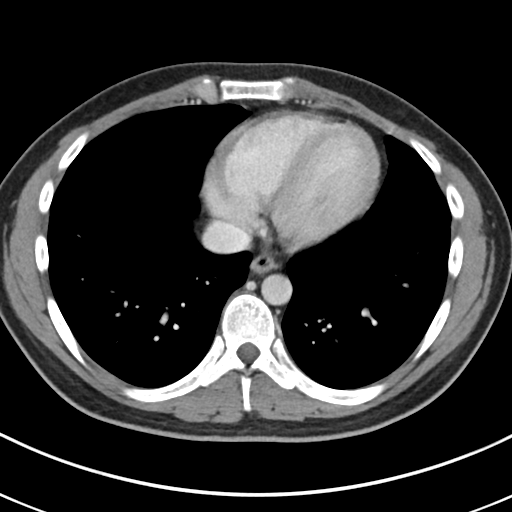

[Series 6: coronal soft tissue · coronal · 0.69mm/px · 3 of 101 slices shown]
[im 34/101  soft-tissue]
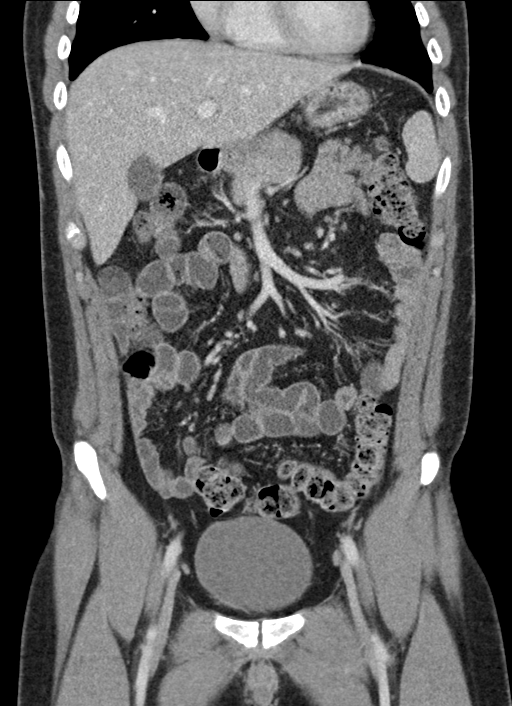
[im 45/101  soft-tissue]
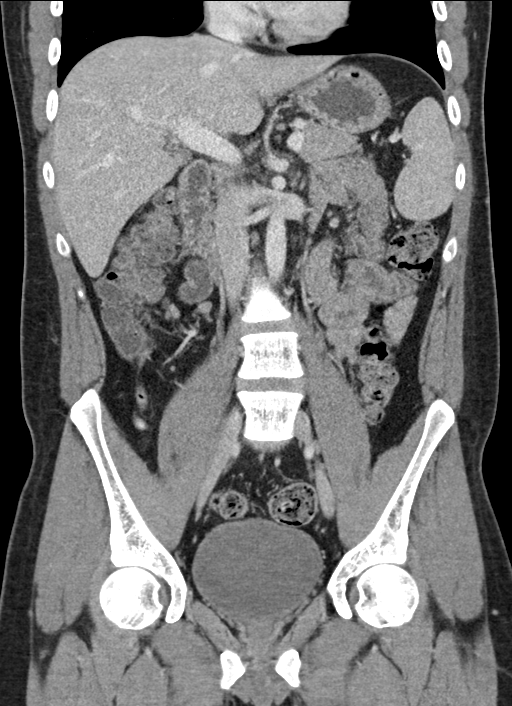
[im 56/101  soft-tissue]
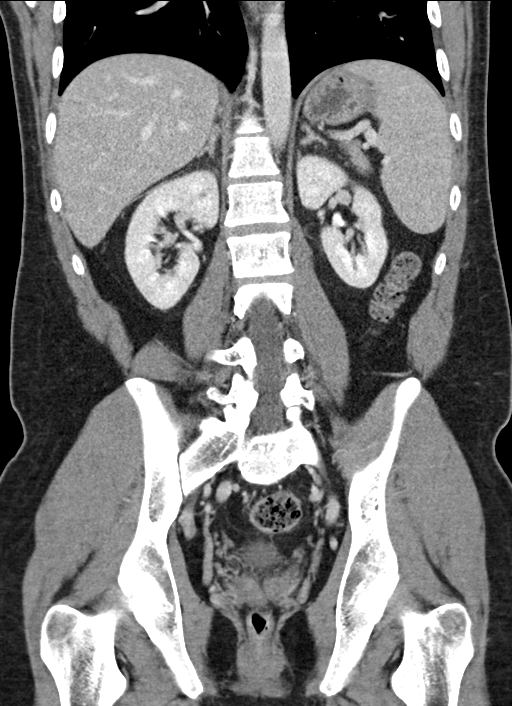

[16 of 46 positions shown; findings below may reference images not displayed]

FINDINGS: Lower chest: Trace pericardial effusion.

Hepatobiliary: Unremarkable

Pancreas: Unremarkable

Spleen: Unremarkable

Adrenals/Urinary Tract: Unremarkable

Stomach/Bowel: Upper normal amount of stool in the colon. Normal
appendix. No dilated bowel.

Vascular/Lymphatic: Unremarkable

Reproductive: Unremarkable

Other: No supplemental non-categorized findings.

Musculoskeletal: Unremarkable
IMPRESSION: 1. A specific cause for the patient's symptoms is not identified.
There is no dilated bowel to suggest obstruction.
2. Questionable trace pericardial effusion anteriorly.
